# Patient Record
Sex: Male | Born: 1955 | Race: White | Hispanic: No | Marital: Married | Smoking: Never smoker
Health system: Southern US, Community
[De-identification: ages and names within clinical notes are randomized; demographics above are authoritative.]

## PROBLEM LIST (undated history)

## (undated) DIAGNOSIS — Z87442 Personal history of urinary calculi: Secondary | ICD-10-CM

## (undated) DIAGNOSIS — E119 Type 2 diabetes mellitus without complications: Secondary | ICD-10-CM

---

## 2016-03-15 ENCOUNTER — Other Ambulatory Visit (HOSPITAL_COMMUNITY): Payer: Self-pay | Admitting: Ophthalmology

## 2016-03-15 DIAGNOSIS — H53451 Other localized visual field defect, right eye: Secondary | ICD-10-CM

## 2016-03-22 ENCOUNTER — Ambulatory Visit (HOSPITAL_COMMUNITY)
Admission: RE | Admit: 2016-03-22 | Discharge: 2016-03-22 | Disposition: A | Discharge status: Home / Self Care | Payer: Medicare Other | Source: Ambulatory Visit | Attending: Ophthalmology | Admitting: Ophthalmology

## 2016-03-22 DIAGNOSIS — H53451 Other localized visual field defect, right eye: Secondary | ICD-10-CM

## 2016-03-22 DIAGNOSIS — R9082 White matter disease, unspecified: Secondary | ICD-10-CM | POA: Diagnosis not present

## 2016-03-22 DIAGNOSIS — J329 Chronic sinusitis, unspecified: Secondary | ICD-10-CM | POA: Insufficient documentation

## 2016-03-22 LAB — POCT I-STAT CREATININE: CREATININE: 1.2 mg/dL (ref 0.61–1.24)

## 2016-03-22 MED ORDER — GADOBENATE DIMEGLUMINE 529 MG/ML IV SOLN
20.0000 mL | Freq: Once | INTRAVENOUS | Status: AC | PRN
  Administered 2016-03-22: 17 mL via INTRAVENOUS

## 2016-12-07 DIAGNOSIS — H401123 Primary open-angle glaucoma, left eye, severe stage: Secondary | ICD-10-CM | POA: Diagnosis not present

## 2016-12-07 DIAGNOSIS — H401112 Primary open-angle glaucoma, right eye, moderate stage: Secondary | ICD-10-CM | POA: Diagnosis not present

## 2017-01-13 DIAGNOSIS — H401112 Primary open-angle glaucoma, right eye, moderate stage: Secondary | ICD-10-CM | POA: Diagnosis not present

## 2017-01-13 DIAGNOSIS — H401123 Primary open-angle glaucoma, left eye, severe stage: Secondary | ICD-10-CM | POA: Diagnosis not present

## 2017-04-04 DIAGNOSIS — R42 Dizziness and giddiness: Secondary | ICD-10-CM | POA: Diagnosis not present

## 2017-04-26 DIAGNOSIS — H5212 Myopia, left eye: Secondary | ICD-10-CM | POA: Diagnosis not present

## 2017-04-26 DIAGNOSIS — H524 Presbyopia: Secondary | ICD-10-CM | POA: Diagnosis not present

## 2017-04-26 DIAGNOSIS — H52222 Regular astigmatism, left eye: Secondary | ICD-10-CM | POA: Diagnosis not present

## 2017-04-26 DIAGNOSIS — H401132 Primary open-angle glaucoma, bilateral, moderate stage: Secondary | ICD-10-CM | POA: Diagnosis not present

## 2017-04-26 DIAGNOSIS — H04123 Dry eye syndrome of bilateral lacrimal glands: Secondary | ICD-10-CM | POA: Diagnosis not present

## 2017-06-21 DIAGNOSIS — H401112 Primary open-angle glaucoma, right eye, moderate stage: Secondary | ICD-10-CM | POA: Diagnosis not present

## 2017-06-21 DIAGNOSIS — H401123 Primary open-angle glaucoma, left eye, severe stage: Secondary | ICD-10-CM | POA: Diagnosis not present

## 2017-07-23 DIAGNOSIS — K8050 Calculus of bile duct without cholangitis or cholecystitis without obstruction: Secondary | ICD-10-CM | POA: Diagnosis not present

## 2017-07-23 DIAGNOSIS — R112 Nausea with vomiting, unspecified: Secondary | ICD-10-CM | POA: Diagnosis not present

## 2017-07-23 DIAGNOSIS — Z7984 Long term (current) use of oral hypoglycemic drugs: Secondary | ICD-10-CM | POA: Diagnosis not present

## 2017-07-23 DIAGNOSIS — K819 Cholecystitis, unspecified: Secondary | ICD-10-CM | POA: Diagnosis not present

## 2017-07-23 DIAGNOSIS — K2900 Acute gastritis without bleeding: Secondary | ICD-10-CM | POA: Diagnosis not present

## 2017-07-23 DIAGNOSIS — K8012 Calculus of gallbladder with acute and chronic cholecystitis without obstruction: Secondary | ICD-10-CM | POA: Diagnosis not present

## 2017-07-23 DIAGNOSIS — R102 Pelvic and perineal pain: Secondary | ICD-10-CM | POA: Diagnosis not present

## 2017-07-23 DIAGNOSIS — R7303 Prediabetes: Secondary | ICD-10-CM | POA: Diagnosis not present

## 2017-07-23 DIAGNOSIS — K8043 Calculus of bile duct with acute cholecystitis with obstruction: Secondary | ICD-10-CM | POA: Diagnosis not present

## 2017-07-23 DIAGNOSIS — R69 Illness, unspecified: Secondary | ICD-10-CM | POA: Diagnosis not present

## 2017-07-23 DIAGNOSIS — K838 Other specified diseases of biliary tract: Secondary | ICD-10-CM | POA: Diagnosis not present

## 2017-07-23 DIAGNOSIS — K4090 Unilateral inguinal hernia, without obstruction or gangrene, not specified as recurrent: Secondary | ICD-10-CM | POA: Diagnosis not present

## 2017-07-23 DIAGNOSIS — E119 Type 2 diabetes mellitus without complications: Secondary | ICD-10-CM | POA: Diagnosis not present

## 2017-07-23 DIAGNOSIS — K8070 Calculus of gallbladder and bile duct without cholecystitis without obstruction: Secondary | ICD-10-CM | POA: Diagnosis not present

## 2017-07-23 DIAGNOSIS — K8040 Calculus of bile duct with cholecystitis, unspecified, without obstruction: Secondary | ICD-10-CM | POA: Diagnosis not present

## 2017-07-23 DIAGNOSIS — R740 Nonspecific elevation of levels of transaminase and lactic acid dehydrogenase [LDH]: Secondary | ICD-10-CM | POA: Diagnosis not present

## 2017-07-23 DIAGNOSIS — R945 Abnormal results of liver function studies: Secondary | ICD-10-CM | POA: Diagnosis not present

## 2017-07-23 DIAGNOSIS — K830 Cholangitis: Secondary | ICD-10-CM | POA: Diagnosis not present

## 2017-07-23 DIAGNOSIS — B179 Acute viral hepatitis, unspecified: Secondary | ICD-10-CM | POA: Diagnosis not present

## 2017-07-23 DIAGNOSIS — R1110 Vomiting, unspecified: Secondary | ICD-10-CM | POA: Diagnosis not present

## 2017-07-23 DIAGNOSIS — R9431 Abnormal electrocardiogram [ECG] [EKG]: Secondary | ICD-10-CM | POA: Diagnosis not present

## 2017-07-23 DIAGNOSIS — K269 Duodenal ulcer, unspecified as acute or chronic, without hemorrhage or perforation: Secondary | ICD-10-CM | POA: Diagnosis not present

## 2017-07-23 DIAGNOSIS — K2950 Unspecified chronic gastritis without bleeding: Secondary | ICD-10-CM | POA: Diagnosis not present

## 2017-07-23 DIAGNOSIS — R0789 Other chest pain: Secondary | ICD-10-CM | POA: Diagnosis not present

## 2017-07-23 DIAGNOSIS — R933 Abnormal findings on diagnostic imaging of other parts of digestive tract: Secondary | ICD-10-CM | POA: Diagnosis not present

## 2017-07-23 DIAGNOSIS — R109 Unspecified abdominal pain: Secondary | ICD-10-CM | POA: Diagnosis not present

## 2017-07-23 DIAGNOSIS — K8032 Calculus of bile duct with acute cholangitis without obstruction: Secondary | ICD-10-CM | POA: Diagnosis not present

## 2017-07-23 DIAGNOSIS — K8020 Calculus of gallbladder without cholecystitis without obstruction: Secondary | ICD-10-CM | POA: Diagnosis not present

## 2017-07-23 DIAGNOSIS — B199 Unspecified viral hepatitis without hepatic coma: Secondary | ICD-10-CM | POA: Diagnosis not present

## 2017-07-23 DIAGNOSIS — R12 Heartburn: Secondary | ICD-10-CM | POA: Diagnosis not present

## 2017-07-23 DIAGNOSIS — H409 Unspecified glaucoma: Secondary | ICD-10-CM | POA: Diagnosis not present

## 2017-07-23 DIAGNOSIS — I10 Essential (primary) hypertension: Secondary | ICD-10-CM | POA: Diagnosis not present

## 2017-07-23 DIAGNOSIS — M1990 Unspecified osteoarthritis, unspecified site: Secondary | ICD-10-CM | POA: Diagnosis not present

## 2017-07-23 DIAGNOSIS — R197 Diarrhea, unspecified: Secondary | ICD-10-CM | POA: Diagnosis not present

## 2017-07-23 DIAGNOSIS — Z7982 Long term (current) use of aspirin: Secondary | ICD-10-CM | POA: Diagnosis not present

## 2017-07-23 DIAGNOSIS — K760 Fatty (change of) liver, not elsewhere classified: Secondary | ICD-10-CM | POA: Diagnosis not present

## 2017-07-23 DIAGNOSIS — R748 Abnormal levels of other serum enzymes: Secondary | ICD-10-CM | POA: Diagnosis not present

## 2017-07-23 DIAGNOSIS — R140 Abdominal distension (gaseous): Secondary | ICD-10-CM | POA: Diagnosis not present

## 2017-07-23 DIAGNOSIS — Z7401 Bed confinement status: Secondary | ICD-10-CM | POA: Diagnosis not present

## 2017-07-25 DIAGNOSIS — K8032 Calculus of bile duct with acute cholangitis without obstruction: Secondary | ICD-10-CM | POA: Diagnosis not present

## 2017-07-25 DIAGNOSIS — R7303 Prediabetes: Secondary | ICD-10-CM | POA: Diagnosis not present

## 2017-07-25 DIAGNOSIS — K8050 Calculus of bile duct without cholangitis or cholecystitis without obstruction: Secondary | ICD-10-CM | POA: Diagnosis not present

## 2017-07-25 DIAGNOSIS — K4090 Unilateral inguinal hernia, without obstruction or gangrene, not specified as recurrent: Secondary | ICD-10-CM | POA: Diagnosis not present

## 2017-07-25 DIAGNOSIS — K8043 Calculus of bile duct with acute cholecystitis with obstruction: Secondary | ICD-10-CM | POA: Diagnosis not present

## 2017-07-25 DIAGNOSIS — R740 Nonspecific elevation of levels of transaminase and lactic acid dehydrogenase [LDH]: Secondary | ICD-10-CM | POA: Diagnosis not present

## 2017-07-25 DIAGNOSIS — K838 Other specified diseases of biliary tract: Secondary | ICD-10-CM | POA: Diagnosis not present

## 2017-07-25 DIAGNOSIS — K830 Cholangitis: Secondary | ICD-10-CM | POA: Diagnosis not present

## 2017-07-25 DIAGNOSIS — K8040 Calculus of bile duct with cholecystitis, unspecified, without obstruction: Secondary | ICD-10-CM | POA: Diagnosis not present

## 2017-07-25 DIAGNOSIS — K2950 Unspecified chronic gastritis without bleeding: Secondary | ICD-10-CM | POA: Diagnosis not present

## 2017-07-25 DIAGNOSIS — K269 Duodenal ulcer, unspecified as acute or chronic, without hemorrhage or perforation: Secondary | ICD-10-CM | POA: Diagnosis not present

## 2017-07-25 DIAGNOSIS — Z7982 Long term (current) use of aspirin: Secondary | ICD-10-CM | POA: Diagnosis not present

## 2017-07-25 DIAGNOSIS — H409 Unspecified glaucoma: Secondary | ICD-10-CM | POA: Diagnosis not present

## 2017-07-25 DIAGNOSIS — R748 Abnormal levels of other serum enzymes: Secondary | ICD-10-CM | POA: Diagnosis not present

## 2017-07-25 DIAGNOSIS — K819 Cholecystitis, unspecified: Secondary | ICD-10-CM | POA: Diagnosis not present

## 2017-07-25 DIAGNOSIS — K760 Fatty (change of) liver, not elsewhere classified: Secondary | ICD-10-CM | POA: Diagnosis not present

## 2017-07-25 DIAGNOSIS — K8012 Calculus of gallbladder with acute and chronic cholecystitis without obstruction: Secondary | ICD-10-CM | POA: Diagnosis not present

## 2017-07-25 DIAGNOSIS — R9431 Abnormal electrocardiogram [ECG] [EKG]: Secondary | ICD-10-CM | POA: Diagnosis not present

## 2017-07-25 DIAGNOSIS — R1033 Periumbilical pain: Secondary | ICD-10-CM | POA: Diagnosis not present

## 2017-07-25 DIAGNOSIS — E119 Type 2 diabetes mellitus without complications: Secondary | ICD-10-CM | POA: Diagnosis not present

## 2017-07-25 DIAGNOSIS — Z7984 Long term (current) use of oral hypoglycemic drugs: Secondary | ICD-10-CM | POA: Diagnosis not present

## 2017-07-25 DIAGNOSIS — K8309 Other cholangitis: Secondary | ICD-10-CM | POA: Insufficient documentation

## 2017-09-30 DIAGNOSIS — H401123 Primary open-angle glaucoma, left eye, severe stage: Secondary | ICD-10-CM | POA: Diagnosis not present

## 2017-09-30 DIAGNOSIS — H401112 Primary open-angle glaucoma, right eye, moderate stage: Secondary | ICD-10-CM | POA: Diagnosis not present

## 2018-01-31 DIAGNOSIS — H401123 Primary open-angle glaucoma, left eye, severe stage: Secondary | ICD-10-CM | POA: Diagnosis not present

## 2018-03-03 DIAGNOSIS — H04123 Dry eye syndrome of bilateral lacrimal glands: Secondary | ICD-10-CM | POA: Diagnosis not present

## 2018-03-03 DIAGNOSIS — H401132 Primary open-angle glaucoma, bilateral, moderate stage: Secondary | ICD-10-CM | POA: Diagnosis not present

## 2018-03-03 DIAGNOSIS — H2513 Age-related nuclear cataract, bilateral: Secondary | ICD-10-CM | POA: Diagnosis not present

## 2018-04-13 DIAGNOSIS — H2513 Age-related nuclear cataract, bilateral: Secondary | ICD-10-CM | POA: Diagnosis not present

## 2018-04-13 DIAGNOSIS — H401112 Primary open-angle glaucoma, right eye, moderate stage: Secondary | ICD-10-CM | POA: Diagnosis not present

## 2018-04-13 DIAGNOSIS — H401123 Primary open-angle glaucoma, left eye, severe stage: Secondary | ICD-10-CM | POA: Diagnosis not present

## 2018-05-31 DIAGNOSIS — H269 Unspecified cataract: Secondary | ICD-10-CM | POA: Diagnosis not present

## 2018-05-31 DIAGNOSIS — Z79899 Other long term (current) drug therapy: Secondary | ICD-10-CM | POA: Diagnosis not present

## 2018-05-31 DIAGNOSIS — H401123 Primary open-angle glaucoma, left eye, severe stage: Secondary | ICD-10-CM | POA: Diagnosis not present

## 2018-05-31 DIAGNOSIS — H401112 Primary open-angle glaucoma, right eye, moderate stage: Secondary | ICD-10-CM | POA: Diagnosis not present

## 2018-06-13 DIAGNOSIS — Z79899 Other long term (current) drug therapy: Secondary | ICD-10-CM | POA: Diagnosis not present

## 2018-06-13 DIAGNOSIS — H2513 Age-related nuclear cataract, bilateral: Secondary | ICD-10-CM | POA: Diagnosis not present

## 2018-06-13 DIAGNOSIS — H401112 Primary open-angle glaucoma, right eye, moderate stage: Secondary | ICD-10-CM | POA: Diagnosis not present

## 2018-06-13 DIAGNOSIS — H401123 Primary open-angle glaucoma, left eye, severe stage: Secondary | ICD-10-CM | POA: Diagnosis not present

## 2018-07-26 DIAGNOSIS — H401112 Primary open-angle glaucoma, right eye, moderate stage: Secondary | ICD-10-CM | POA: Diagnosis not present

## 2018-07-26 DIAGNOSIS — H269 Unspecified cataract: Secondary | ICD-10-CM | POA: Diagnosis not present

## 2018-07-26 DIAGNOSIS — Z79899 Other long term (current) drug therapy: Secondary | ICD-10-CM | POA: Diagnosis not present

## 2018-07-26 DIAGNOSIS — H401123 Primary open-angle glaucoma, left eye, severe stage: Secondary | ICD-10-CM | POA: Diagnosis not present

## 2018-08-25 DIAGNOSIS — Z0100 Encounter for examination of eyes and vision without abnormal findings: Secondary | ICD-10-CM | POA: Diagnosis not present

## 2018-09-20 DIAGNOSIS — G4700 Insomnia, unspecified: Secondary | ICD-10-CM | POA: Diagnosis not present

## 2018-10-01 DIAGNOSIS — W57XXXA Bitten or stung by nonvenomous insect and other nonvenomous arthropods, initial encounter: Secondary | ICD-10-CM | POA: Diagnosis not present

## 2018-10-01 DIAGNOSIS — T148XXA Other injury of unspecified body region, initial encounter: Secondary | ICD-10-CM | POA: Diagnosis not present

## 2018-10-01 DIAGNOSIS — R5383 Other fatigue: Secondary | ICD-10-CM | POA: Diagnosis not present

## 2018-10-17 DIAGNOSIS — Z1211 Encounter for screening for malignant neoplasm of colon: Secondary | ICD-10-CM | POA: Diagnosis not present

## 2018-10-17 DIAGNOSIS — R634 Abnormal weight loss: Secondary | ICD-10-CM | POA: Diagnosis not present

## 2018-10-17 DIAGNOSIS — R030 Elevated blood-pressure reading, without diagnosis of hypertension: Secondary | ICD-10-CM | POA: Diagnosis not present

## 2018-10-17 DIAGNOSIS — R7309 Other abnormal glucose: Secondary | ICD-10-CM | POA: Diagnosis not present

## 2018-10-17 DIAGNOSIS — G4700 Insomnia, unspecified: Secondary | ICD-10-CM | POA: Diagnosis not present

## 2018-10-17 DIAGNOSIS — R69 Illness, unspecified: Secondary | ICD-10-CM | POA: Diagnosis not present

## 2018-10-17 DIAGNOSIS — H409 Unspecified glaucoma: Secondary | ICD-10-CM | POA: Diagnosis not present

## 2018-10-17 DIAGNOSIS — Z125 Encounter for screening for malignant neoplasm of prostate: Secondary | ICD-10-CM | POA: Diagnosis not present

## 2018-10-19 DIAGNOSIS — Z1211 Encounter for screening for malignant neoplasm of colon: Secondary | ICD-10-CM | POA: Diagnosis not present

## 2018-11-14 DIAGNOSIS — H401123 Primary open-angle glaucoma, left eye, severe stage: Secondary | ICD-10-CM | POA: Diagnosis not present

## 2018-11-14 DIAGNOSIS — H401112 Primary open-angle glaucoma, right eye, moderate stage: Secondary | ICD-10-CM | POA: Diagnosis not present

## 2018-11-14 DIAGNOSIS — H2513 Age-related nuclear cataract, bilateral: Secondary | ICD-10-CM | POA: Diagnosis not present

## 2019-04-23 DIAGNOSIS — H2513 Age-related nuclear cataract, bilateral: Secondary | ICD-10-CM | POA: Diagnosis not present

## 2019-04-23 DIAGNOSIS — H401123 Primary open-angle glaucoma, left eye, severe stage: Secondary | ICD-10-CM | POA: Diagnosis not present

## 2019-04-23 DIAGNOSIS — Z79899 Other long term (current) drug therapy: Secondary | ICD-10-CM | POA: Diagnosis not present

## 2019-04-23 DIAGNOSIS — H401112 Primary open-angle glaucoma, right eye, moderate stage: Secondary | ICD-10-CM | POA: Diagnosis not present

## 2019-06-01 DIAGNOSIS — R7303 Prediabetes: Secondary | ICD-10-CM | POA: Diagnosis not present

## 2019-06-01 DIAGNOSIS — Z789 Other specified health status: Secondary | ICD-10-CM | POA: Diagnosis not present

## 2019-06-01 DIAGNOSIS — R69 Illness, unspecified: Secondary | ICD-10-CM | POA: Diagnosis not present

## 2019-06-01 DIAGNOSIS — H409 Unspecified glaucoma: Secondary | ICD-10-CM | POA: Diagnosis not present

## 2019-06-11 DIAGNOSIS — Z79899 Other long term (current) drug therapy: Secondary | ICD-10-CM | POA: Diagnosis not present

## 2019-06-11 DIAGNOSIS — H401123 Primary open-angle glaucoma, left eye, severe stage: Secondary | ICD-10-CM | POA: Diagnosis not present

## 2019-06-11 DIAGNOSIS — H2513 Age-related nuclear cataract, bilateral: Secondary | ICD-10-CM | POA: Diagnosis not present

## 2019-06-11 DIAGNOSIS — H401112 Primary open-angle glaucoma, right eye, moderate stage: Secondary | ICD-10-CM | POA: Diagnosis not present

## 2019-06-18 DIAGNOSIS — Z789 Other specified health status: Secondary | ICD-10-CM | POA: Diagnosis not present

## 2019-06-18 DIAGNOSIS — Z136 Encounter for screening for cardiovascular disorders: Secondary | ICD-10-CM | POA: Diagnosis not present

## 2019-06-18 DIAGNOSIS — Z125 Encounter for screening for malignant neoplasm of prostate: Secondary | ICD-10-CM | POA: Diagnosis not present

## 2019-06-18 DIAGNOSIS — R7303 Prediabetes: Secondary | ICD-10-CM | POA: Diagnosis not present

## 2019-07-27 DIAGNOSIS — Z23 Encounter for immunization: Secondary | ICD-10-CM | POA: Diagnosis not present

## 2019-10-30 DIAGNOSIS — E785 Hyperlipidemia, unspecified: Secondary | ICD-10-CM | POA: Diagnosis not present

## 2019-10-30 DIAGNOSIS — Z23 Encounter for immunization: Secondary | ICD-10-CM | POA: Diagnosis not present

## 2019-10-30 DIAGNOSIS — R7303 Prediabetes: Secondary | ICD-10-CM | POA: Diagnosis not present

## 2019-11-12 DIAGNOSIS — H401123 Primary open-angle glaucoma, left eye, severe stage: Secondary | ICD-10-CM | POA: Diagnosis not present

## 2019-11-12 DIAGNOSIS — H2513 Age-related nuclear cataract, bilateral: Secondary | ICD-10-CM | POA: Diagnosis not present

## 2019-11-12 DIAGNOSIS — H401112 Primary open-angle glaucoma, right eye, moderate stage: Secondary | ICD-10-CM | POA: Diagnosis not present

## 2019-11-14 DIAGNOSIS — G4700 Insomnia, unspecified: Secondary | ICD-10-CM | POA: Diagnosis not present

## 2019-11-14 DIAGNOSIS — R69 Illness, unspecified: Secondary | ICD-10-CM | POA: Diagnosis not present

## 2019-11-14 DIAGNOSIS — R7303 Prediabetes: Secondary | ICD-10-CM | POA: Diagnosis not present

## 2019-11-14 DIAGNOSIS — E785 Hyperlipidemia, unspecified: Secondary | ICD-10-CM | POA: Diagnosis not present

## 2020-01-23 DIAGNOSIS — M79671 Pain in right foot: Secondary | ICD-10-CM | POA: Diagnosis not present

## 2020-03-11 DIAGNOSIS — H401112 Primary open-angle glaucoma, right eye, moderate stage: Secondary | ICD-10-CM | POA: Diagnosis not present

## 2020-03-11 DIAGNOSIS — H2513 Age-related nuclear cataract, bilateral: Secondary | ICD-10-CM | POA: Diagnosis not present

## 2020-03-11 DIAGNOSIS — H401123 Primary open-angle glaucoma, left eye, severe stage: Secondary | ICD-10-CM | POA: Diagnosis not present

## 2020-03-11 DIAGNOSIS — H269 Unspecified cataract: Secondary | ICD-10-CM | POA: Diagnosis not present

## 2020-06-27 DIAGNOSIS — R7303 Prediabetes: Secondary | ICD-10-CM | POA: Diagnosis not present

## 2020-06-27 DIAGNOSIS — R030 Elevated blood-pressure reading, without diagnosis of hypertension: Secondary | ICD-10-CM | POA: Diagnosis not present

## 2020-06-27 DIAGNOSIS — Z0000 Encounter for general adult medical examination without abnormal findings: Secondary | ICD-10-CM | POA: Diagnosis not present

## 2020-06-27 DIAGNOSIS — E785 Hyperlipidemia, unspecified: Secondary | ICD-10-CM | POA: Diagnosis not present

## 2020-06-27 DIAGNOSIS — Z1211 Encounter for screening for malignant neoplasm of colon: Secondary | ICD-10-CM | POA: Diagnosis not present

## 2020-06-27 DIAGNOSIS — M79671 Pain in right foot: Secondary | ICD-10-CM | POA: Diagnosis not present

## 2020-06-27 DIAGNOSIS — Z125 Encounter for screening for malignant neoplasm of prostate: Secondary | ICD-10-CM | POA: Diagnosis not present

## 2020-07-15 DIAGNOSIS — Z23 Encounter for immunization: Secondary | ICD-10-CM | POA: Diagnosis not present

## 2020-07-18 ENCOUNTER — Other Ambulatory Visit: Payer: Self-pay

## 2020-07-18 ENCOUNTER — Ambulatory Visit: Payer: Medicare HMO | Admitting: Podiatry

## 2020-07-18 ENCOUNTER — Ambulatory Visit (INDEPENDENT_AMBULATORY_CARE_PROVIDER_SITE_OTHER): Payer: Medicare HMO

## 2020-07-18 ENCOUNTER — Encounter: Payer: Self-pay | Admitting: Podiatry

## 2020-07-18 DIAGNOSIS — M7751 Other enthesopathy of right foot: Secondary | ICD-10-CM | POA: Diagnosis not present

## 2020-07-18 DIAGNOSIS — M722 Plantar fascial fibromatosis: Secondary | ICD-10-CM

## 2020-07-18 DIAGNOSIS — M79672 Pain in left foot: Secondary | ICD-10-CM | POA: Diagnosis not present

## 2020-07-18 DIAGNOSIS — M7730 Calcaneal spur, unspecified foot: Secondary | ICD-10-CM | POA: Diagnosis not present

## 2020-07-18 MED ORDER — MELOXICAM 15 MG PO TABS: 15.0000 mg | ORAL_TABLET | Freq: Every day | ORAL | 0 refills | Status: AC

## 2020-07-18 NOTE — Patient Instructions (Signed)
For instructions on how to put on your Plantar Fascial Brace, please visit [email or web address]   Plantar Fasciitis (Heel Spur Syndrome) with Rehab The plantar fascia is a fibrous, ligament-like, soft-tissue structure that spans the bottom of the foot. Plantar fasciitis is a condition that causes pain in the foot due to inflammation of the tissue. SYMPTOMS   Pain and tenderness on the underneath side of the foot.  Pain that worsens with standing or walking. CAUSES  Plantar fasciitis is caused by irritation and injury to the plantar fascia on the underneath side of the foot. Common mechanisms of injury include:  Direct trauma to bottom of the foot.  Damage to a small nerve that runs under the foot where the main fascia attaches to the heel bone.  Stress placed on the plantar fascia due to bone spurs. RISK INCREASES WITH:   Activities that place stress on the plantar fascia (running, jumping, pivoting, or cutting).  Poor strength and flexibility.  Improperly fitted shoes.  Tight calf muscles.  Flat feet.  Failure to warm-up properly before activity.  Obesity. PREVENTION  Warm up and stretch properly before activity.  Allow for adequate recovery between workouts.  Maintain physical fitness:  Strength, flexibility, and endurance.  Cardiovascular fitness.  Maintain a health body weight.  Avoid stress on the plantar fascia.  Wear properly fitted shoes, including arch supports for individuals who have flat feet.  PROGNOSIS  If treated properly, then the symptoms of plantar fasciitis usually resolve without surgery. However, occasionally surgery is necessary.  RELATED COMPLICATIONS   Recurrent symptoms that may result in a chronic condition.  Problems of the lower back that are caused by compensating for the injury, such as limping.  Pain or weakness of the foot during push-off following surgery.  Chronic inflammation, scarring, and partial or complete  fascia tear, occurring more often from repeated injections.  TREATMENT  Treatment initially involves the use of ice and medication to help reduce pain and inflammation. The use of strengthening and stretching exercises may help reduce pain with activity, especially stretches of the Achilles tendon. These exercises may be performed at home or with a therapist. Your caregiver may recommend that you use heel cups of arch supports to help reduce stress on the plantar fascia. Occasionally, corticosteroid injections are given to reduce inflammation. If symptoms persist for greater than 6 months despite non-surgical (conservative), then surgery may be recommended.   MEDICATION   If pain medication is necessary, then nonsteroidal anti-inflammatory medications, such as aspirin and ibuprofen, or other minor pain relievers, such as acetaminophen, are often recommended.  Do not take pain medication within 7 days before surgery.  Prescription pain relievers may be given if deemed necessary by your caregiver. Use only as directed and only as much as you need.  Corticosteroid injections may be given by your caregiver. These injections should be reserved for the most serious cases, because they may only be given a certain number of times.  HEAT AND COLD  Cold treatment (icing) relieves pain and reduces inflammation. Cold treatment should be applied for 10 to 15 minutes every 2 to 3 hours for inflammation and pain and immediately after any activity that aggravates your symptoms. Use ice packs or massage the area with a piece of ice (ice massage).  Heat treatment may be used prior to performing the stretching and strengthening activities prescribed by your caregiver, physical therapist, or athletic trainer. Use a heat pack or soak the injury in warm water.  SEEK IMMEDIATE MEDICAL   CARE IF:  Treatment seems to offer no benefit, or the condition worsens.  Any medications produce adverse side effects.   EXERCISES- RANGE OF MOTION (ROM) AND STRETCHING EXERCISES - Plantar Fasciitis (Heel Spur Syndrome) These exercises may help you when beginning to rehabilitate your injury. Your symptoms may resolve with or without further involvement from your physician, physical therapist or athletic trainer. While completing these exercises, remember:   Restoring tissue flexibility helps normal motion to return to the joints. This allows healthier, less painful movement and activity.  An effective stretch should be held for at least 30 seconds.  A stretch should never be painful. You should only feel a gentle lengthening or release in the stretched tissue.  RANGE OF MOTION - Toe Extension, Flexion  Sit with your right / left leg crossed over your opposite knee.  Grasp your toes and gently pull them back toward the top of your foot. You should feel a stretch on the bottom of your toes and/or foot.  Hold this stretch for 10 seconds.  Now, gently pull your toes toward the bottom of your foot. You should feel a stretch on the top of your toes and or foot.  Hold this stretch for 10 seconds. Repeat  times. Complete this stretch 3 times per day.   RANGE OF MOTION - Ankle Dorsiflexion, Active Assisted  Remove shoes and sit on a chair that is preferably not on a carpeted surface.  Place right / left foot under knee. Extend your opposite leg for support.  Keeping your heel down, slide your right / left foot back toward the chair until you feel a stretch at your ankle or calf. If you do not feel a stretch, slide your bottom forward to the edge of the chair, while still keeping your heel down.  Hold this stretch for 10 seconds. Repeat 3 times. Complete this stretch 2 times per day.   STRETCH  Gastroc, Standing  Place hands on wall.  Extend right / left leg, keeping the front knee somewhat bent.  Slightly point your toes inward on your back foot.  Keeping your right / left heel on the floor and your  knee straight, shift your weight toward the wall, not allowing your back to arch.  You should feel a gentle stretch in the right / left calf. Hold this position for 10 seconds. Repeat 3 times. Complete this stretch 2 times per day.  STRETCH  Soleus, Standing  Place hands on wall.  Extend right / left leg, keeping the other knee somewhat bent.  Slightly point your toes inward on your back foot.  Keep your right / left heel on the floor, bend your back knee, and slightly shift your weight over the back leg so that you feel a gentle stretch deep in your back calf.  Hold this position for 10 seconds. Repeat 3 times. Complete this stretch 2 times per day.  STRETCH  Gastrocsoleus, Standing  Note: This exercise can place a lot of stress on your foot and ankle. Please complete this exercise only if specifically instructed by your caregiver.   Place the ball of your right / left foot on a step, keeping your other foot firmly on the same step.  Hold on to the wall or a rail for balance.  Slowly lift your other foot, allowing your body weight to press your heel down over the edge of the step.  You should feel a stretch in your right / left calf.  Hold this   position for 10 seconds.  Repeat this exercise with a slight bend in your right / left knee. Repeat 3 times. Complete this stretch 2 times per day.   STRENGTHENING EXERCISES - Plantar Fasciitis (Heel Spur Syndrome)  These exercises may help you when beginning to rehabilitate your injury. They may resolve your symptoms with or without further involvement from your physician, physical therapist or athletic trainer. While completing these exercises, remember:   Muscles can gain both the endurance and the strength needed for everyday activities through controlled exercises.  Complete these exercises as instructed by your physician, physical therapist or athletic trainer. Progress the resistance and repetitions only as guided.  STRENGTH -  Towel Curls  Sit in a chair positioned on a non-carpeted surface.  Place your foot on a towel, keeping your heel on the floor.  Pull the towel toward your heel by only curling your toes. Keep your heel on the floor. Repeat 3 times. Complete this exercise 2 times per day.  STRENGTH - Ankle Inversion  Secure one end of a rubber exercise band/tubing to a fixed object (table, pole). Loop the other end around your foot just before your toes.  Place your fists between your knees. This will focus your strengthening at your ankle.  Slowly, pull your big toe up and in, making sure the band/tubing is positioned to resist the entire motion.  Hold this position for 10 seconds.  Have your muscles resist the band/tubing as it slowly pulls your foot back to the starting position. Repeat 3 times. Complete this exercises 2 times per day.  Document Released: 10/18/2005 Document Revised: 01/10/2012 Document Reviewed: 01/30/2009 [employer] Patient Information 2014 [employer]. 

## 2020-07-18 NOTE — Progress Notes (Signed)
Subjective:   Patient ID: Ethan Cox, male   DOB: 63 y.o.   MRN: [id number]   HPI 64 year old male presents the office today for concerns of bilateral heel pain, plan fasciitis which has been ongoing since March 2021. Over the last couple months he started get pain to the lateral aspect of the ankle on the right side. Denies any recent injury or falls. Due to the plantar fasciitis has been doing aggressive stretching to help with this which is doing better but it seems aggravate the ankle. He has not seen any swelling. No recent injury or falls. No rating pain or weakness. He did purchase some Dr. Margart Sickles inserts which were not helping. He also wears sketchers for shoes.   Review of Systems  All other systems reviewed and are negative.  History reviewed. No pertinent past medical history.  History reviewed. No pertinent surgical history.   Current Outpatient Medications:  .  acetaminophen (TYLENOL) 325 MG tablet, Take by mouth., Disp: , Rfl:  .  brimonidine (ALPHAGAN) 0.2 % ophthalmic solution, Place 1 drop into the left eye 3 times daily., Disp: , Rfl:  .  dorzolamide-timolol (COSOPT) 22.3-6.8 MG/ML ophthalmic solution, Place 1 drop into both eyes 2 times daily., Disp: , Rfl:  .  latanoprost (XALATAN) 0.005 % ophthalmic solution, INSTILL 1 DROP INTO BOTH EYES EVERY DAY AT NIGHT, Disp: , Rfl:  .  metFORMIN (GLUCOPHAGE) 500 MG tablet, Take by mouth., Disp: , Rfl:  .  oxyCODONE (OXY IR/ROXICODONE) 5 MG immediate release tablet, Take by mouth., Disp: , Rfl:  .  pantoprazole (PROTONIX) 40 MG tablet, Take by mouth., Disp: , Rfl:  .  ALPRAZolam (XANAX) 0.5 MG tablet, Take 0.25-0.5 mg by mouth at bedtime as needed., Disp: , Rfl:  .  aspirin 81 MG chewable tablet, Chew by mouth., Disp: , Rfl:  .  dorzolamide-timolol (COSOPT) 22.3-6.8 MG/ML ophthalmic solution, 1 drop 2 (two) times daily., Disp: , Rfl:  .  ibuprofen (ADVIL) 200 MG tablet, Take by mouth., Disp: , Rfl:  .  meloxicam (MOBIC) 15 MG  tablet, Take 1 tablet (15 mg total) by mouth daily., Disp: 30 tablet, Rfl: 0  No Known Allergies       Objective:  Physical Exam  General: AAO x3, NAD  Dermatological: Skin is warm, dry and supple bilateral. There are no open sores, no preulcerative lesions, no rash or signs of infection present.  Vascular: Dorsalis Pedis artery and Posterior Tibial artery pedal pulses are 2/4 bilateral with immedate capillary fill time. There is no pain with calf compression, swelling, warmth, erythema.   Neruologic: Grossly intact via light touch bilateral. Negative L-spine  Musculoskeletal: There is tenderness palpation on plantar medial tubercle of the calcaneus at the insertion of plantar fascia bilaterally. Plantar fascia appears to be intact. Achilles tendon appears to be intact but he does describe some discomfort on the right side of the Achilles tendon. There is no significant pain to the Achilles tendon today. Mild discomfort along the course the peroneal tendons inferior posterior lateral malleolus. Minimal discomfort in the anterior lateral aspect ankle joint. No area of pinpoint tenderness. Muscular strength 5/5 in all groups tested bilateral. Calcaneal varus is present  Gait: Unassisted, Nonantalgic.       Assessment:   Bilateral chronic plantar fasciitis with the right capsulitis/tendinitis likely result of compensation     Plan:  -Treatment options discussed including all alternatives, risks, and complications -Etiology of symptoms were discussed -X-rays were obtained and independently reviewed I  also reviewed them with him. Calcaneal spurring is evident there is no evidence of acute fracture. -Prescribed meloxicam and discussed side effects. He wants a local steroid injection. Recommend ice daily. -Stretching, icing daily -Plantar fascial braces dispensed x2 -He has a night splint at home -Discussed changing shoes. Also discussed custom orthotics. He can start by changing shoes  first.  Vivi Barrack DPM

## 2020-07-21 DIAGNOSIS — H269 Unspecified cataract: Secondary | ICD-10-CM | POA: Diagnosis not present

## 2020-07-21 DIAGNOSIS — H524 Presbyopia: Secondary | ICD-10-CM | POA: Diagnosis not present

## 2020-07-22 DIAGNOSIS — H5213 Myopia, bilateral: Secondary | ICD-10-CM | POA: Diagnosis not present

## 2020-07-22 DIAGNOSIS — H52209 Unspecified astigmatism, unspecified eye: Secondary | ICD-10-CM | POA: Diagnosis not present

## 2020-07-28 DIAGNOSIS — R972 Elevated prostate specific antigen [PSA]: Secondary | ICD-10-CM | POA: Diagnosis not present

## 2020-08-22 ENCOUNTER — Ambulatory Visit: Payer: Medicare HMO | Admitting: Podiatry

## 2020-09-09 DIAGNOSIS — H401123 Primary open-angle glaucoma, left eye, severe stage: Secondary | ICD-10-CM | POA: Diagnosis not present

## 2020-09-09 DIAGNOSIS — H2513 Age-related nuclear cataract, bilateral: Secondary | ICD-10-CM | POA: Diagnosis not present

## 2020-09-09 DIAGNOSIS — H401112 Primary open-angle glaucoma, right eye, moderate stage: Secondary | ICD-10-CM | POA: Diagnosis not present

## 2020-10-07 DIAGNOSIS — R69 Illness, unspecified: Secondary | ICD-10-CM | POA: Diagnosis not present

## 2020-10-22 DIAGNOSIS — Z20822 Contact with and (suspected) exposure to covid-19: Secondary | ICD-10-CM | POA: Diagnosis not present

## 2020-12-04 ENCOUNTER — Emergency Department
Admission: EM | Admit: 2020-12-04 | Discharge: 2020-12-04 | Disposition: A | Discharge status: Home / Self Care | Payer: Medicare HMO | Source: Home / Self Care | Attending: Family Medicine | Admitting: Family Medicine

## 2020-12-04 DIAGNOSIS — K1120 Sialoadenitis, unspecified: Secondary | ICD-10-CM

## 2020-12-04 MED ORDER — AMOXICILLIN-POT CLAVULANATE 875-125 MG PO TABS: 1.0000 | ORAL_TABLET | Freq: Two times a day (BID) | ORAL | 0 refills | Status: AC

## 2020-12-04 NOTE — ED Triage Notes (Signed)
Patient presents to Urgent Care with complaints of right lower dental pain and swelling since earlier today. Patient reports he had a tooth pulled about 2 months ago, does have a dentist appt already 5 days from now.

## 2020-12-04 NOTE — Discharge Instructions (Signed)
Drink enough fluid to keep your urine pale yellow. Keep your mouth clean and moist. Try sucking on sour candy. This may help to make your mouth less dry by stimulating the flow of saliva.   If symptoms become significantly worse during the night or over the weekend, proceed to the local emergency room.

## 2020-12-04 NOTE — ED Provider Notes (Signed)
[facility]    CSN: [id number] Arrival date & time: 12/04/20  1603      History   Chief Complaint Chief Complaint  Patient presents with  . Dental Pain    Right Lower    HPI Ethan Cox is a 65 y.o. male.   Patient complains of onset of sudden painless swelling in his right cheek below his ear at noon today while eating.  He states that he had a right tooth extracted two months ago and is concerned that he may have a tooth abscess.  However, he has had no recent tooth pain or gingival swelling.  He notes that the swelling has been gradually receding, and when he again put food in his mouth he had a sudden burning sensation in his right cheek.  He denies fevers, chills, and sweats.  The history is provided by the patient.    History reviewed. No pertinent past medical history.  Patient Active Problem List   Diagnosis Date Noted  . Ascending cholangitis 07/25/2017    History reviewed. No pertinent surgical history.     Home Medications    Prior to Admission medications   Medication Sig Start Date End Date Taking? Authorizing Provider  amoxicillin-clavulanate (AUGMENTIN) 875-125 MG tablet Take 1 tablet by mouth every 12 (twelve) hours for 10 days. 12/04/20 12/14/20 Yes Lattie Haw, MD  acetaminophen (TYLENOL) 325 MG tablet Take by mouth. 07/28/17   [provider]  ALPRAZolam Prudy Feeler) 0.5 MG tablet Take 0.25-0.5 mg by mouth at bedtime as needed. 04/23/20   [provider]  aspirin 81 MG chewable tablet Chew by mouth.    [provider]  brimonidine (ALPHAGAN) 0.2 % ophthalmic solution Place 1 drop into the left eye 3 times daily. 11/14/18   [provider]  dorzolamide-timolol (COSOPT) 22.3-6.8 MG/ML ophthalmic solution Place 1 drop into both eyes 2 times daily. 07/11/17   [provider]  dorzolamide-timolol (COSOPT) 22.3-6.8 MG/ML ophthalmic solution 1 drop 2 (two) times daily. 06/01/20   [provider]   ibuprofen (ADVIL) 200 MG tablet Take by mouth.    [provider]  latanoprost (XALATAN) 0.005 % ophthalmic solution INSTILL 1 DROP INTO BOTH EYES EVERY DAY AT NIGHT 07/11/17   [provider]  meloxicam (MOBIC) 15 MG tablet Take 1 tablet (15 mg total) by mouth daily. 07/18/20 07/18/21  Vivi Barrack, DPM  metFORMIN (GLUCOPHAGE) 500 MG tablet Take by mouth. 07/28/17   [provider]  oxyCODONE (OXY IR/ROXICODONE) 5 MG immediate release tablet Take by mouth. 07/28/17   [provider]  pantoprazole (PROTONIX) 40 MG tablet Take by mouth. 07/28/17   [provider]    Family History Family History  Problem Relation Age of Onset  . Healthy Mother   . Healthy Father     Social History Social History   Tobacco Use  . Smoking status: Never Smoker  . Smokeless tobacco: Never Used  Substance Use Topics  . Alcohol use: Yes    Comment: occ     Allergies   Patient has no known allergies.   Review of Systems Review of Systems  Constitutional: Negative for activity change, appetite change, chills, diaphoresis, fatigue and fever.  HENT: Positive for dental problem and facial swelling. Negative for congestion, drooling, ear discharge, ear pain, mouth sores, rhinorrhea, sinus pressure, sinus pain, sore throat and trouble swallowing.   All other systems reviewed and are negative.    Physical Exam Triage Vital Signs ED  Triage Vitals  Enc Vitals Group     BP 12/04/20 1635 (!) 160/86     Pulse Rate 12/04/20 1635 84     Resp 12/04/20 1635 16     Temp 12/04/20 1635 98.3 F (36.8 C)     Temp Source 12/04/20 1635 Oral     SpO2 12/04/20 1635 99 %     Weight --      Height --      Head Circumference --      Peak Flow --      Pain Score 12/04/20 1634 2     Pain Loc --      Pain Edu? --      Excl. in GC? --    No data found.  Updated Vital Signs BP (!) 160/86 (BP Location: Right Arm)   Pulse 84   Temp 98.3 F (36.8 C) (Oral)   Resp  16   SpO2 99%   Visual Acuity Right Eye Distance:   Left Eye Distance:   Bilateral Distance:    Right Eye Near:   Left Eye Near:    Bilateral Near:     Physical Exam Vitals and nursing note reviewed.  Constitutional:      General: He is not in acute distress. HENT:     Head:     Jaw: Tenderness present. No trismus.     Salivary Glands: Right salivary gland is diffusely enlarged.      Comments: There is swelling and mild tenderness to palpation over the right inferior parotid gland.  No erythema or warmth.    Right Ear: Tympanic membrane, ear canal and external ear normal.     Left Ear: Tympanic membrane, ear canal and external ear normal.     Nose: Nose normal.     Mouth/Throat:     Mouth: Mucous membranes are moist. No oral lesions.     Dentition: Normal dentition. No dental tenderness, gingival swelling, dental caries or gum lesions.     Pharynx: Oropharynx is clear.  Eyes:     Extraocular Movements: Extraocular movements intact.     Conjunctiva/sclera: Conjunctivae normal.     Pupils: Pupils are equal, round, and reactive to light.  Cardiovascular:     Rate and Rhythm: Normal rate and regular rhythm.     Heart sounds: Normal heart sounds.  Pulmonary:     Breath sounds: Normal breath sounds.  Musculoskeletal:     Cervical back: Neck supple.  Lymphadenopathy:     Cervical: No cervical adenopathy.  Skin:    General: Skin is warm and dry.     Findings: No rash.  Neurological:     Mental Status: He is alert and oriented to person, place, and time.      UC Treatments / Results  Labs (all labs ordered are listed, but only abnormal results are displayed) Labs Reviewed - No data to display  EKG   Radiology No results found.  Procedures Procedures (including critical care time)  Medications Ordered in UC Medications - No data to display  Initial Impression / Assessment and Plan / UC Course  I have reviewed the triage vital signs and the nursing  notes.  Pertinent labs & imaging results that were available during my care of the patient were reviewed by me and considered in my medical decision making (see chart for details).    Suspect salivary gland duct stone. Begin Augmentin. Followup with ENT as soon as possible.    Final Clinical Impressions(s) /  UC Diagnoses   Final diagnoses:  Parotitis not due to mumps     Discharge Instructions     1. Drink enough fluid to keep your urine pale yellow. 2. Keep your mouth clean and moist. 3. Try sucking on sour candy. This may help to make your mouth less dry by stimulating the flow of saliva.   If symptoms become significantly worse during the night or over the weekend, proceed to the local emergency room.     ED Prescriptions    Medication Sig Dispense Auth. Provider   amoxicillin-clavulanate (AUGMENTIN) 875-125 MG tablet Take 1 tablet by mouth every 12 (twelve) hours for 10 days. 20 tablet Lattie Haw, MD        Lattie Haw, MD 12/06/20 [phone]

## 2020-12-12 DIAGNOSIS — H401123 Primary open-angle glaucoma, left eye, severe stage: Secondary | ICD-10-CM | POA: Diagnosis not present

## 2020-12-12 DIAGNOSIS — H2513 Age-related nuclear cataract, bilateral: Secondary | ICD-10-CM | POA: Diagnosis not present

## 2020-12-12 DIAGNOSIS — H401112 Primary open-angle glaucoma, right eye, moderate stage: Secondary | ICD-10-CM | POA: Diagnosis not present

## 2020-12-30 DIAGNOSIS — Z23 Encounter for immunization: Secondary | ICD-10-CM | POA: Diagnosis not present

## 2021-01-23 DIAGNOSIS — H401112 Primary open-angle glaucoma, right eye, moderate stage: Secondary | ICD-10-CM | POA: Diagnosis not present

## 2021-01-23 DIAGNOSIS — H2513 Age-related nuclear cataract, bilateral: Secondary | ICD-10-CM | POA: Diagnosis not present

## 2021-01-23 DIAGNOSIS — H401123 Primary open-angle glaucoma, left eye, severe stage: Secondary | ICD-10-CM | POA: Diagnosis not present

## 2021-01-23 DIAGNOSIS — Z79899 Other long term (current) drug therapy: Secondary | ICD-10-CM | POA: Diagnosis not present

## 2021-02-17 DIAGNOSIS — H2513 Age-related nuclear cataract, bilateral: Secondary | ICD-10-CM | POA: Diagnosis not present

## 2021-02-17 DIAGNOSIS — H401123 Primary open-angle glaucoma, left eye, severe stage: Secondary | ICD-10-CM | POA: Diagnosis not present

## 2021-02-17 DIAGNOSIS — Z79899 Other long term (current) drug therapy: Secondary | ICD-10-CM | POA: Diagnosis not present

## 2021-02-17 DIAGNOSIS — H401112 Primary open-angle glaucoma, right eye, moderate stage: Secondary | ICD-10-CM | POA: Diagnosis not present

## 2021-04-14 DIAGNOSIS — H2513 Age-related nuclear cataract, bilateral: Secondary | ICD-10-CM | POA: Diagnosis not present

## 2021-04-14 DIAGNOSIS — Z79899 Other long term (current) drug therapy: Secondary | ICD-10-CM | POA: Diagnosis not present

## 2021-04-14 DIAGNOSIS — H401112 Primary open-angle glaucoma, right eye, moderate stage: Secondary | ICD-10-CM | POA: Diagnosis not present

## 2021-04-14 DIAGNOSIS — H401123 Primary open-angle glaucoma, left eye, severe stage: Secondary | ICD-10-CM | POA: Diagnosis not present

## 2021-06-24 DIAGNOSIS — H401123 Primary open-angle glaucoma, left eye, severe stage: Secondary | ICD-10-CM | POA: Diagnosis not present

## 2021-06-25 DIAGNOSIS — H401112 Primary open-angle glaucoma, right eye, moderate stage: Secondary | ICD-10-CM | POA: Diagnosis not present

## 2021-06-25 DIAGNOSIS — H401123 Primary open-angle glaucoma, left eye, severe stage: Secondary | ICD-10-CM | POA: Diagnosis not present

## 2021-06-25 DIAGNOSIS — Z48810 Encounter for surgical aftercare following surgery on the sense organs: Secondary | ICD-10-CM | POA: Diagnosis not present

## 2021-06-25 DIAGNOSIS — H2513 Age-related nuclear cataract, bilateral: Secondary | ICD-10-CM | POA: Diagnosis not present

## 2021-06-25 DIAGNOSIS — Z9689 Presence of other specified functional implants: Secondary | ICD-10-CM | POA: Diagnosis not present

## 2021-06-25 DIAGNOSIS — Z79899 Other long term (current) drug therapy: Secondary | ICD-10-CM | POA: Diagnosis not present

## 2021-06-29 DIAGNOSIS — H401123 Primary open-angle glaucoma, left eye, severe stage: Secondary | ICD-10-CM | POA: Diagnosis not present

## 2021-06-29 DIAGNOSIS — Z79899 Other long term (current) drug therapy: Secondary | ICD-10-CM | POA: Diagnosis not present

## 2021-06-29 DIAGNOSIS — Z9689 Presence of other specified functional implants: Secondary | ICD-10-CM | POA: Diagnosis not present

## 2021-06-29 DIAGNOSIS — Z48810 Encounter for surgical aftercare following surgery on the sense organs: Secondary | ICD-10-CM | POA: Diagnosis not present

## 2021-06-29 DIAGNOSIS — H401112 Primary open-angle glaucoma, right eye, moderate stage: Secondary | ICD-10-CM | POA: Diagnosis not present

## 2021-06-29 DIAGNOSIS — H2513 Age-related nuclear cataract, bilateral: Secondary | ICD-10-CM | POA: Diagnosis not present

## 2021-06-30 DIAGNOSIS — H401123 Primary open-angle glaucoma, left eye, severe stage: Secondary | ICD-10-CM | POA: Diagnosis not present

## 2021-06-30 DIAGNOSIS — Z48810 Encounter for surgical aftercare following surgery on the sense organs: Secondary | ICD-10-CM | POA: Diagnosis not present

## 2021-06-30 DIAGNOSIS — H2513 Age-related nuclear cataract, bilateral: Secondary | ICD-10-CM | POA: Diagnosis not present

## 2021-06-30 DIAGNOSIS — Z79899 Other long term (current) drug therapy: Secondary | ICD-10-CM | POA: Diagnosis not present

## 2021-06-30 DIAGNOSIS — H401112 Primary open-angle glaucoma, right eye, moderate stage: Secondary | ICD-10-CM | POA: Diagnosis not present

## 2021-06-30 DIAGNOSIS — Z9689 Presence of other specified functional implants: Secondary | ICD-10-CM | POA: Diagnosis not present

## 2021-07-02 DIAGNOSIS — Z9689 Presence of other specified functional implants: Secondary | ICD-10-CM | POA: Diagnosis not present

## 2021-07-02 DIAGNOSIS — H401112 Primary open-angle glaucoma, right eye, moderate stage: Secondary | ICD-10-CM | POA: Diagnosis not present

## 2021-07-02 DIAGNOSIS — H2513 Age-related nuclear cataract, bilateral: Secondary | ICD-10-CM | POA: Diagnosis not present

## 2021-07-02 DIAGNOSIS — H401123 Primary open-angle glaucoma, left eye, severe stage: Secondary | ICD-10-CM | POA: Diagnosis not present

## 2021-07-02 DIAGNOSIS — Z79899 Other long term (current) drug therapy: Secondary | ICD-10-CM | POA: Diagnosis not present

## 2021-07-02 DIAGNOSIS — Z48810 Encounter for surgical aftercare following surgery on the sense organs: Secondary | ICD-10-CM | POA: Diagnosis not present

## 2021-07-10 DIAGNOSIS — H401123 Primary open-angle glaucoma, left eye, severe stage: Secondary | ICD-10-CM | POA: Diagnosis not present

## 2021-07-10 DIAGNOSIS — H401112 Primary open-angle glaucoma, right eye, moderate stage: Secondary | ICD-10-CM | POA: Diagnosis not present

## 2021-07-10 DIAGNOSIS — Z79899 Other long term (current) drug therapy: Secondary | ICD-10-CM | POA: Diagnosis not present

## 2021-07-10 DIAGNOSIS — Z9883 Filtering (vitreous) bleb after glaucoma surgery status: Secondary | ICD-10-CM | POA: Diagnosis not present

## 2021-07-10 DIAGNOSIS — H2513 Age-related nuclear cataract, bilateral: Secondary | ICD-10-CM | POA: Diagnosis not present

## 2021-07-24 DIAGNOSIS — M25511 Pain in right shoulder: Secondary | ICD-10-CM | POA: Diagnosis not present

## 2021-07-24 DIAGNOSIS — E785 Hyperlipidemia, unspecified: Secondary | ICD-10-CM | POA: Diagnosis not present

## 2021-07-24 DIAGNOSIS — Z23 Encounter for immunization: Secondary | ICD-10-CM | POA: Diagnosis not present

## 2021-07-24 DIAGNOSIS — Z0000 Encounter for general adult medical examination without abnormal findings: Secondary | ICD-10-CM | POA: Diagnosis not present

## 2021-07-24 DIAGNOSIS — Z125 Encounter for screening for malignant neoplasm of prostate: Secondary | ICD-10-CM | POA: Diagnosis not present

## 2021-07-24 DIAGNOSIS — R7303 Prediabetes: Secondary | ICD-10-CM | POA: Diagnosis not present

## 2021-07-24 DIAGNOSIS — Z1211 Encounter for screening for malignant neoplasm of colon: Secondary | ICD-10-CM | POA: Diagnosis not present

## 2021-07-24 DIAGNOSIS — R030 Elevated blood-pressure reading, without diagnosis of hypertension: Secondary | ICD-10-CM | POA: Diagnosis not present

## 2021-08-06 DIAGNOSIS — H2513 Age-related nuclear cataract, bilateral: Secondary | ICD-10-CM | POA: Diagnosis not present

## 2021-08-06 DIAGNOSIS — H401112 Primary open-angle glaucoma, right eye, moderate stage: Secondary | ICD-10-CM | POA: Diagnosis not present

## 2021-08-06 DIAGNOSIS — Z79899 Other long term (current) drug therapy: Secondary | ICD-10-CM | POA: Diagnosis not present

## 2021-08-06 DIAGNOSIS — H401123 Primary open-angle glaucoma, left eye, severe stage: Secondary | ICD-10-CM | POA: Diagnosis not present

## 2021-08-06 DIAGNOSIS — Z9689 Presence of other specified functional implants: Secondary | ICD-10-CM | POA: Diagnosis not present

## 2021-08-06 DIAGNOSIS — Z48810 Encounter for surgical aftercare following surgery on the sense organs: Secondary | ICD-10-CM | POA: Diagnosis not present

## 2021-08-13 DIAGNOSIS — H2513 Age-related nuclear cataract, bilateral: Secondary | ICD-10-CM | POA: Diagnosis not present

## 2021-08-13 DIAGNOSIS — H401123 Primary open-angle glaucoma, left eye, severe stage: Secondary | ICD-10-CM | POA: Diagnosis not present

## 2021-08-13 DIAGNOSIS — Z9689 Presence of other specified functional implants: Secondary | ICD-10-CM | POA: Diagnosis not present

## 2021-08-13 DIAGNOSIS — H401112 Primary open-angle glaucoma, right eye, moderate stage: Secondary | ICD-10-CM | POA: Diagnosis not present

## 2021-08-13 DIAGNOSIS — Z48810 Encounter for surgical aftercare following surgery on the sense organs: Secondary | ICD-10-CM | POA: Diagnosis not present

## 2021-10-01 DIAGNOSIS — H401123 Primary open-angle glaucoma, left eye, severe stage: Secondary | ICD-10-CM | POA: Diagnosis not present

## 2021-10-01 DIAGNOSIS — Z48810 Encounter for surgical aftercare following surgery on the sense organs: Secondary | ICD-10-CM | POA: Diagnosis not present

## 2021-10-01 DIAGNOSIS — Z9689 Presence of other specified functional implants: Secondary | ICD-10-CM | POA: Diagnosis not present

## 2021-10-01 DIAGNOSIS — H2513 Age-related nuclear cataract, bilateral: Secondary | ICD-10-CM | POA: Diagnosis not present

## 2021-10-01 DIAGNOSIS — H401112 Primary open-angle glaucoma, right eye, moderate stage: Secondary | ICD-10-CM | POA: Diagnosis not present

## 2021-10-01 DIAGNOSIS — Z9883 Filtering (vitreous) bleb after glaucoma surgery status: Secondary | ICD-10-CM | POA: Diagnosis not present

## 2021-10-01 DIAGNOSIS — Z79899 Other long term (current) drug therapy: Secondary | ICD-10-CM | POA: Diagnosis not present

## 2021-10-06 DIAGNOSIS — R7309 Other abnormal glucose: Secondary | ICD-10-CM | POA: Diagnosis not present

## 2021-10-12 DIAGNOSIS — H52209 Unspecified astigmatism, unspecified eye: Secondary | ICD-10-CM | POA: Diagnosis not present

## 2021-10-12 DIAGNOSIS — H5213 Myopia, bilateral: Secondary | ICD-10-CM | POA: Diagnosis not present

## 2021-10-12 DIAGNOSIS — H524 Presbyopia: Secondary | ICD-10-CM | POA: Diagnosis not present

## 2021-11-11 DIAGNOSIS — H52209 Unspecified astigmatism, unspecified eye: Secondary | ICD-10-CM | POA: Diagnosis not present

## 2021-11-11 DIAGNOSIS — H5213 Myopia, bilateral: Secondary | ICD-10-CM | POA: Diagnosis not present

## 2021-11-17 IMAGING — DX DG FOOT COMPLETE 3+V*R*
3 series · 3 of 3 positions shown · non-contrast
Comparison: None.

CLINICAL DATA: Heel pain

EXAM:
RIGHT FOOT COMPLETE - 3+ VIEW

[foot lat]
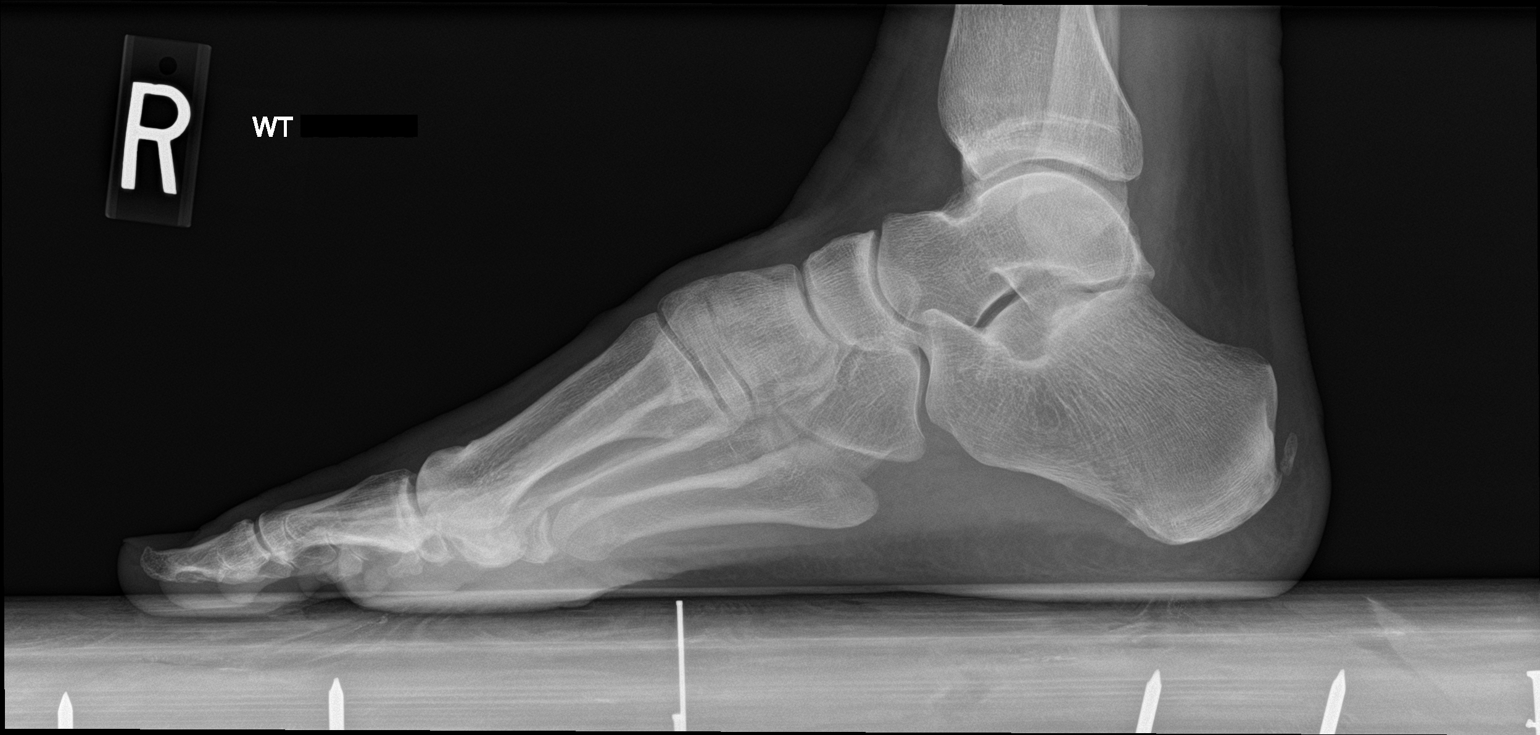

[foot ap]
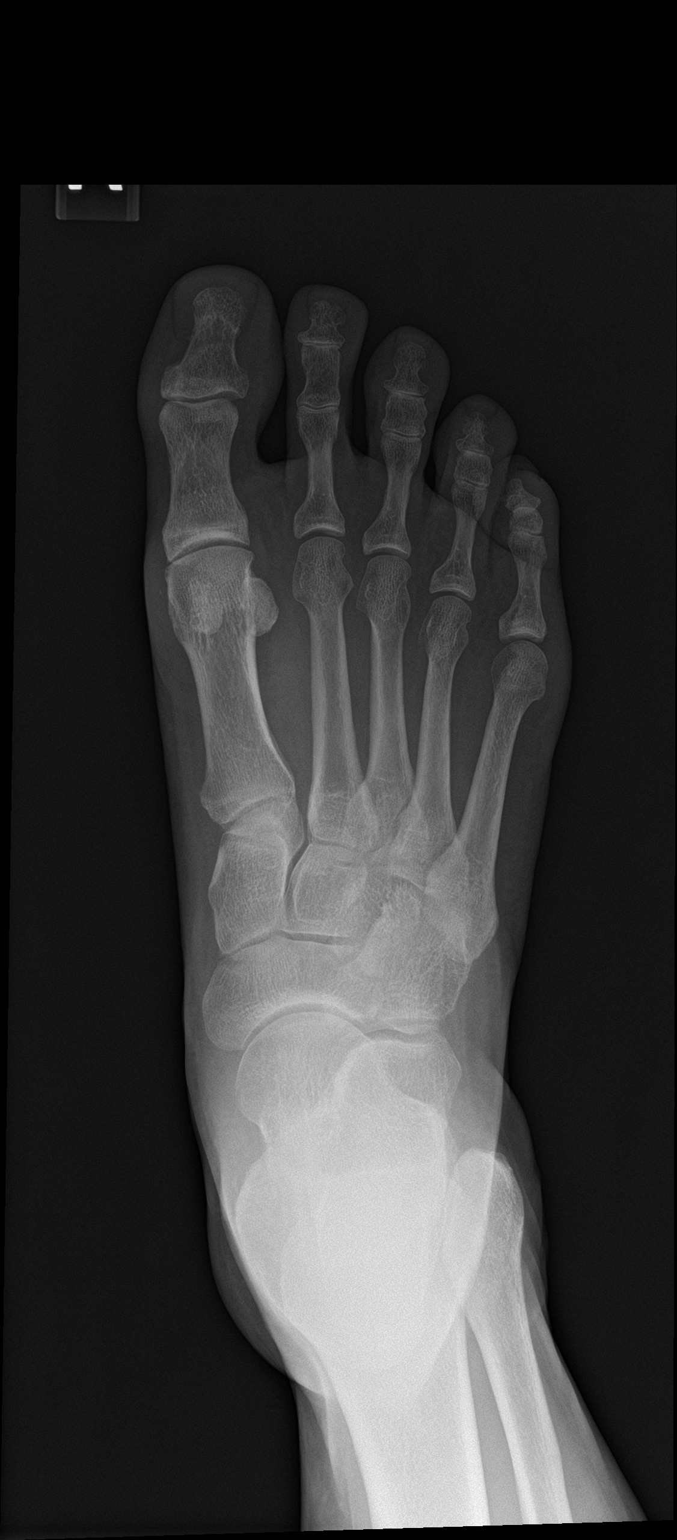

[foot obl]
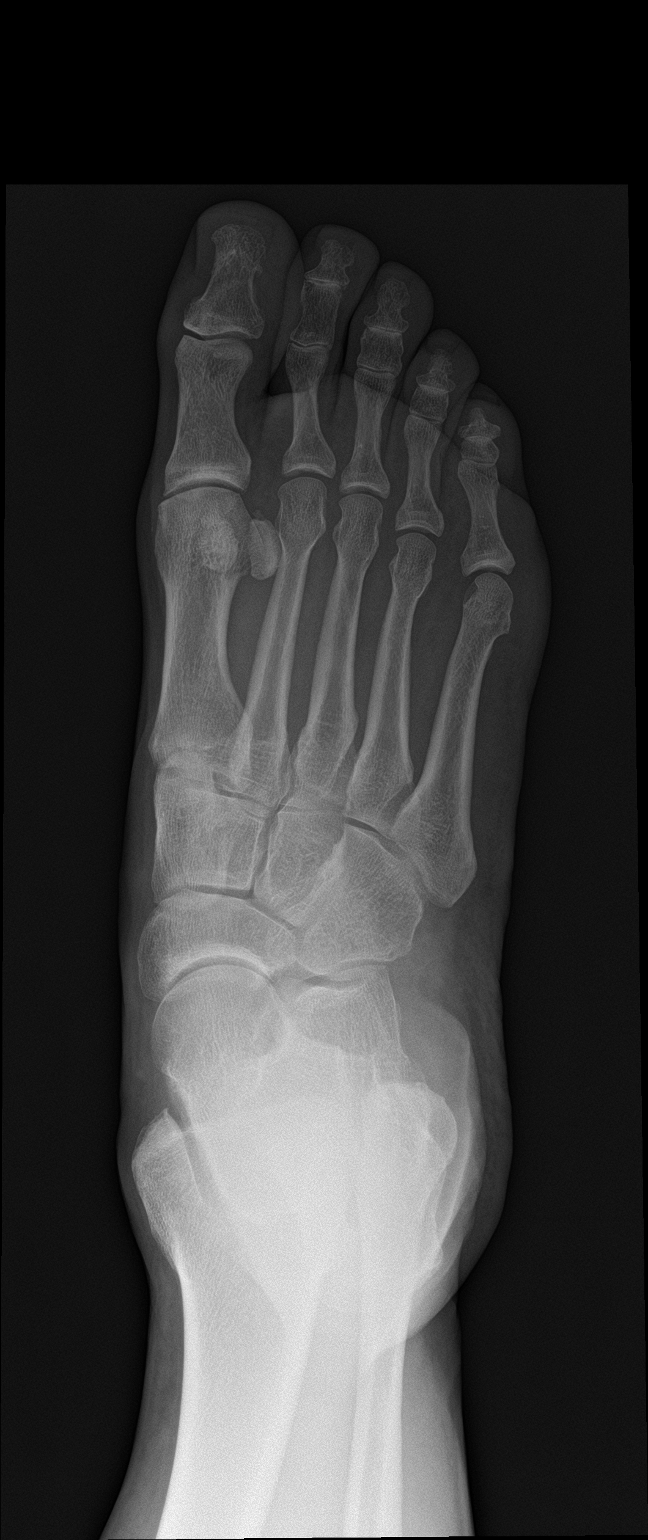

[3 of 3 positions shown; findings below may reference images not displayed]

FINDINGS: No acute fracture or dislocation. Joint spaces and alignment are
maintained. Enthesophyte of the Achilles tendon. No area of erosion
or osseous destruction. No unexpected radiopaque foreign body. Soft
tissues are unremarkable.
IMPRESSION: 1. No acute osseous abnormality, right foot.

## 2021-11-17 IMAGING — DX DG FOOT COMPLETE 3+V*L*
3 series · 3 of 3 positions shown · non-contrast
Comparison: None.

CLINICAL DATA: Heel pain

EXAM:
LEFT FOOT - COMPLETE 3+ VIEW

[foot ap]
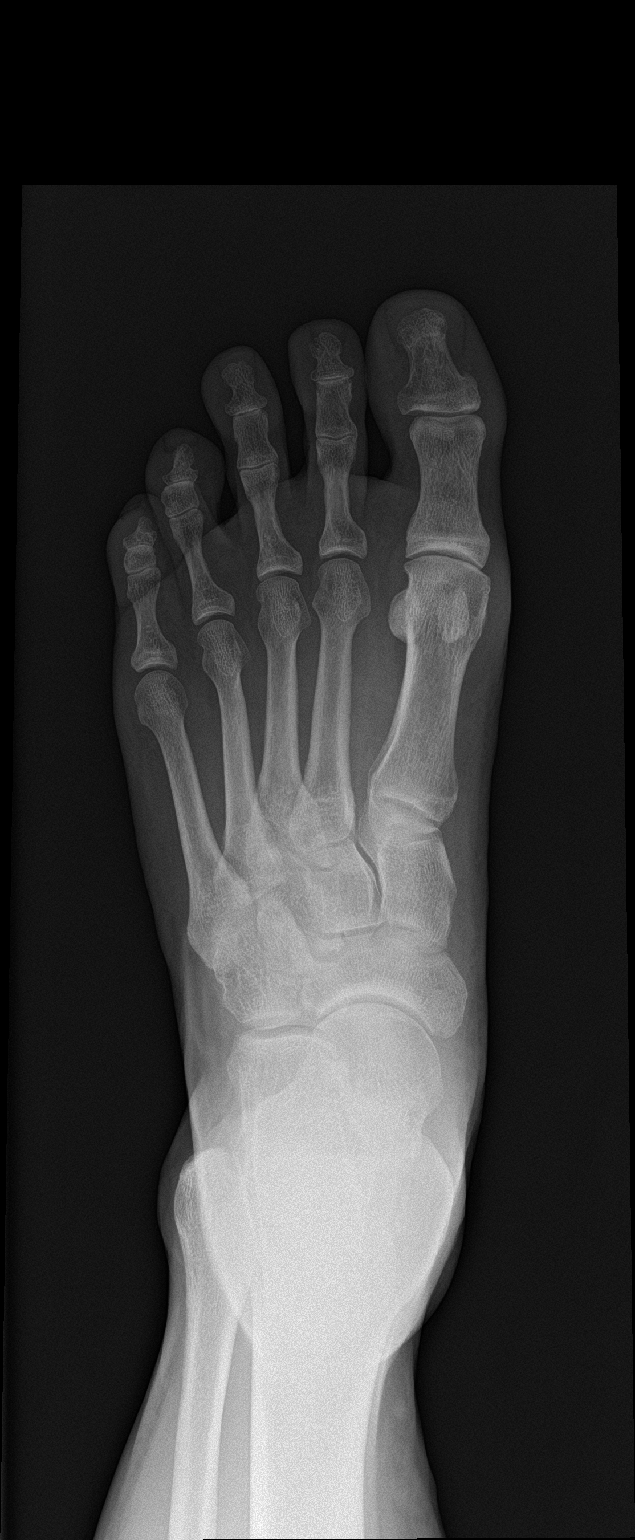

[foot obl]
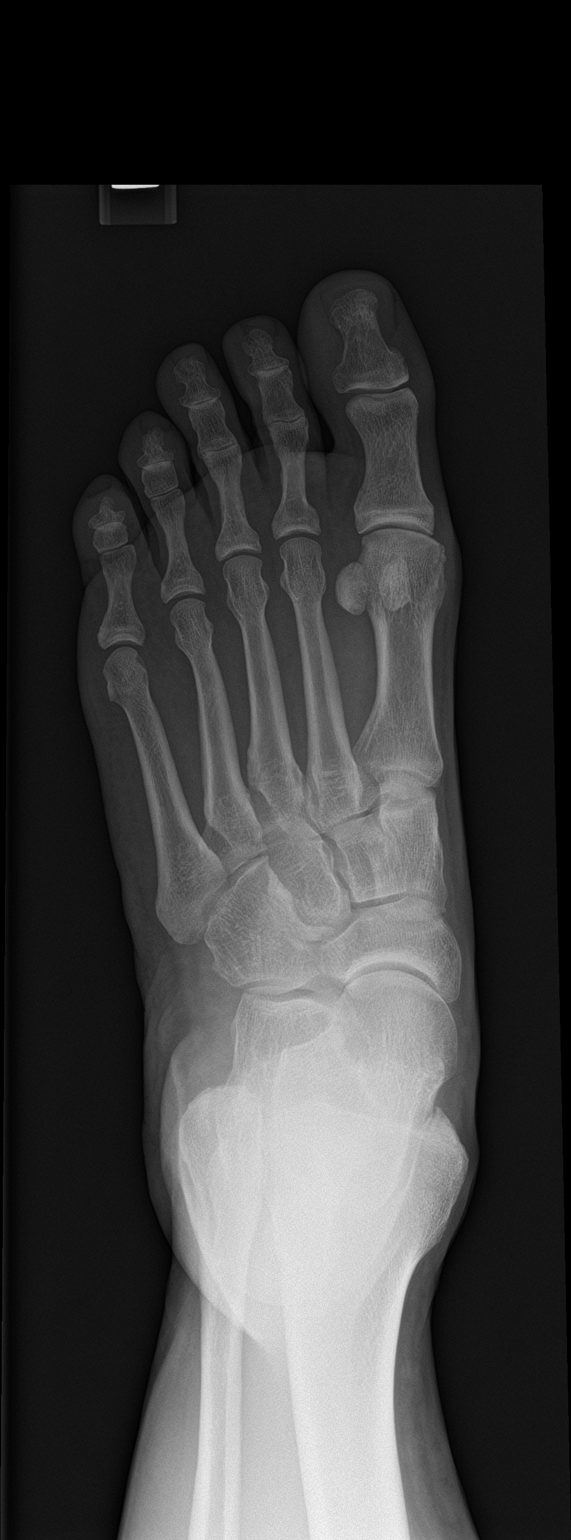

[foot lat]
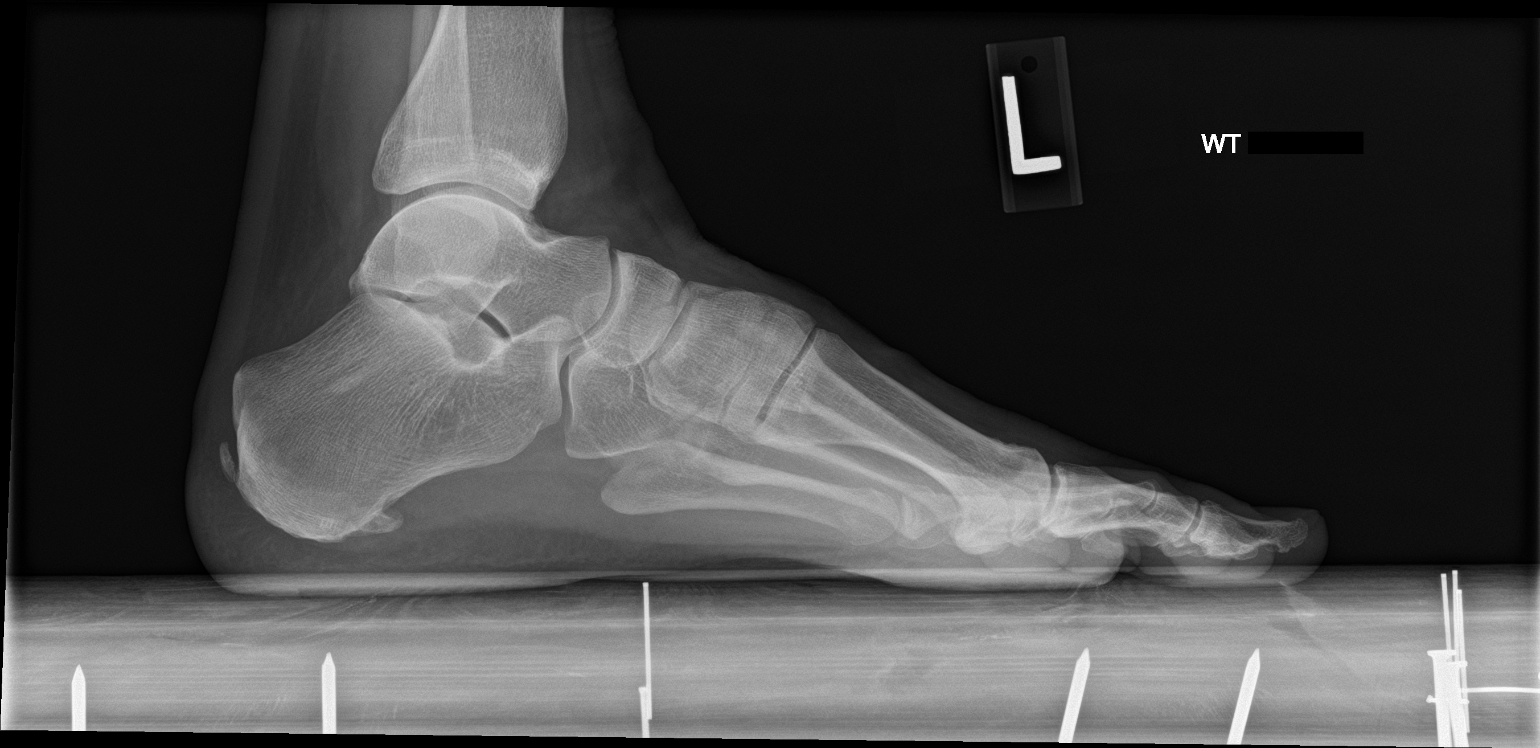

[3 of 3 positions shown; findings below may reference images not displayed]

FINDINGS: No acute fracture or dislocation. Minimal degenerative changes of
the first MTP. Enthesophyte of the Achilles tendon. Enthesophyte of
the plantar calcaneus. No area of erosion or osseous destruction. No
unexpected radiopaque foreign body. Soft tissues are unremarkable.
IMPRESSION: No acute osseous findings.

## 2021-12-24 DIAGNOSIS — H401123 Primary open-angle glaucoma, left eye, severe stage: Secondary | ICD-10-CM | POA: Diagnosis not present

## 2021-12-24 DIAGNOSIS — H2513 Age-related nuclear cataract, bilateral: Secondary | ICD-10-CM | POA: Diagnosis not present

## 2021-12-24 DIAGNOSIS — H401112 Primary open-angle glaucoma, right eye, moderate stage: Secondary | ICD-10-CM | POA: Diagnosis not present

## 2021-12-24 DIAGNOSIS — Z9883 Filtering (vitreous) bleb after glaucoma surgery status: Secondary | ICD-10-CM | POA: Diagnosis not present

## 2022-04-23 DIAGNOSIS — H401123 Primary open-angle glaucoma, left eye, severe stage: Secondary | ICD-10-CM | POA: Diagnosis not present

## 2022-04-23 DIAGNOSIS — Z9883 Filtering (vitreous) bleb after glaucoma surgery status: Secondary | ICD-10-CM | POA: Diagnosis not present

## 2022-04-23 DIAGNOSIS — H2513 Age-related nuclear cataract, bilateral: Secondary | ICD-10-CM | POA: Diagnosis not present

## 2022-04-23 DIAGNOSIS — H401112 Primary open-angle glaucoma, right eye, moderate stage: Secondary | ICD-10-CM | POA: Diagnosis not present

## 2022-04-23 DIAGNOSIS — Z79899 Other long term (current) drug therapy: Secondary | ICD-10-CM | POA: Diagnosis not present

## 2022-08-11 DIAGNOSIS — Z125 Encounter for screening for malignant neoplasm of prostate: Secondary | ICD-10-CM | POA: Diagnosis not present

## 2022-08-11 DIAGNOSIS — R7303 Prediabetes: Secondary | ICD-10-CM | POA: Diagnosis not present

## 2022-08-11 DIAGNOSIS — Z1159 Encounter for screening for other viral diseases: Secondary | ICD-10-CM | POA: Diagnosis not present

## 2022-08-11 DIAGNOSIS — Z23 Encounter for immunization: Secondary | ICD-10-CM | POA: Diagnosis not present

## 2022-08-11 DIAGNOSIS — E785 Hyperlipidemia, unspecified: Secondary | ICD-10-CM | POA: Diagnosis not present

## 2022-08-11 DIAGNOSIS — Z1211 Encounter for screening for malignant neoplasm of colon: Secondary | ICD-10-CM | POA: Diagnosis not present

## 2022-08-11 DIAGNOSIS — R030 Elevated blood-pressure reading, without diagnosis of hypertension: Secondary | ICD-10-CM | POA: Diagnosis not present

## 2022-08-11 DIAGNOSIS — Z0000 Encounter for general adult medical examination without abnormal findings: Secondary | ICD-10-CM | POA: Diagnosis not present

## 2022-08-30 DIAGNOSIS — H401112 Primary open-angle glaucoma, right eye, moderate stage: Secondary | ICD-10-CM | POA: Diagnosis not present

## 2022-08-30 DIAGNOSIS — H401123 Primary open-angle glaucoma, left eye, severe stage: Secondary | ICD-10-CM | POA: Diagnosis not present

## 2022-08-30 DIAGNOSIS — H2513 Age-related nuclear cataract, bilateral: Secondary | ICD-10-CM | POA: Diagnosis not present

## 2022-08-30 DIAGNOSIS — Z978 Presence of other specified devices: Secondary | ICD-10-CM | POA: Diagnosis not present

## 2022-08-30 DIAGNOSIS — Z79899 Other long term (current) drug therapy: Secondary | ICD-10-CM | POA: Diagnosis not present

## 2022-08-30 DIAGNOSIS — Z9883 Filtering (vitreous) bleb after glaucoma surgery status: Secondary | ICD-10-CM | POA: Diagnosis not present

## 2022-11-09 DIAGNOSIS — D72829 Elevated white blood cell count, unspecified: Secondary | ICD-10-CM | POA: Diagnosis not present

## 2022-11-09 DIAGNOSIS — I498 Other specified cardiac arrhythmias: Secondary | ICD-10-CM | POA: Diagnosis not present

## 2022-11-09 DIAGNOSIS — N132 Hydronephrosis with renal and ureteral calculous obstruction: Secondary | ICD-10-CM | POA: Diagnosis not present

## 2022-11-09 DIAGNOSIS — R739 Hyperglycemia, unspecified: Secondary | ICD-10-CM | POA: Diagnosis not present

## 2022-11-09 DIAGNOSIS — E871 Hypo-osmolality and hyponatremia: Secondary | ICD-10-CM | POA: Diagnosis not present

## 2022-11-09 DIAGNOSIS — R911 Solitary pulmonary nodule: Secondary | ICD-10-CM | POA: Diagnosis not present

## 2022-11-09 DIAGNOSIS — R109 Unspecified abdominal pain: Secondary | ICD-10-CM | POA: Diagnosis not present

## 2022-11-09 DIAGNOSIS — N201 Calculus of ureter: Secondary | ICD-10-CM | POA: Diagnosis not present

## 2022-11-09 DIAGNOSIS — R1032 Left lower quadrant pain: Secondary | ICD-10-CM | POA: Diagnosis not present

## 2022-11-09 DIAGNOSIS — R000 Tachycardia, unspecified: Secondary | ICD-10-CM | POA: Diagnosis not present

## 2022-11-09 DIAGNOSIS — Z87442 Personal history of urinary calculi: Secondary | ICD-10-CM | POA: Diagnosis not present

## 2022-11-09 DIAGNOSIS — N1330 Unspecified hydronephrosis: Secondary | ICD-10-CM | POA: Diagnosis not present

## 2022-11-24 DIAGNOSIS — N200 Calculus of kidney: Secondary | ICD-10-CM | POA: Diagnosis not present

## 2022-11-24 DIAGNOSIS — R319 Hematuria, unspecified: Secondary | ICD-10-CM | POA: Diagnosis not present

## 2022-11-24 DIAGNOSIS — R81 Glycosuria: Secondary | ICD-10-CM | POA: Diagnosis not present

## 2022-11-24 DIAGNOSIS — R800 Isolated proteinuria: Secondary | ICD-10-CM | POA: Diagnosis not present

## 2022-11-24 DIAGNOSIS — N201 Calculus of ureter: Secondary | ICD-10-CM | POA: Diagnosis not present

## 2022-12-17 DIAGNOSIS — R81 Glycosuria: Secondary | ICD-10-CM | POA: Diagnosis not present

## 2022-12-17 DIAGNOSIS — N200 Calculus of kidney: Secondary | ICD-10-CM | POA: Diagnosis not present

## 2022-12-17 DIAGNOSIS — N2889 Other specified disorders of kidney and ureter: Secondary | ICD-10-CM | POA: Diagnosis not present

## 2023-01-06 DIAGNOSIS — H401112 Primary open-angle glaucoma, right eye, moderate stage: Secondary | ICD-10-CM | POA: Diagnosis not present

## 2023-01-06 DIAGNOSIS — H2513 Age-related nuclear cataract, bilateral: Secondary | ICD-10-CM | POA: Diagnosis not present

## 2023-01-06 DIAGNOSIS — Z9689 Presence of other specified functional implants: Secondary | ICD-10-CM | POA: Diagnosis not present

## 2023-01-06 DIAGNOSIS — H401123 Primary open-angle glaucoma, left eye, severe stage: Secondary | ICD-10-CM | POA: Diagnosis not present

## 2023-01-24 DIAGNOSIS — E785 Hyperlipidemia, unspecified: Secondary | ICD-10-CM | POA: Diagnosis not present

## 2023-01-24 DIAGNOSIS — E1169 Type 2 diabetes mellitus with other specified complication: Secondary | ICD-10-CM | POA: Diagnosis not present

## 2023-03-02 DIAGNOSIS — M549 Dorsalgia, unspecified: Secondary | ICD-10-CM | POA: Diagnosis not present

## 2023-03-02 DIAGNOSIS — R030 Elevated blood-pressure reading, without diagnosis of hypertension: Secondary | ICD-10-CM | POA: Diagnosis not present

## 2023-03-03 DIAGNOSIS — N390 Urinary tract infection, site not specified: Secondary | ICD-10-CM | POA: Diagnosis not present

## 2023-03-07 DIAGNOSIS — R911 Solitary pulmonary nodule: Secondary | ICD-10-CM | POA: Diagnosis not present

## 2023-03-07 DIAGNOSIS — N200 Calculus of kidney: Secondary | ICD-10-CM | POA: Diagnosis not present

## 2023-03-07 DIAGNOSIS — N433 Hydrocele, unspecified: Secondary | ICD-10-CM | POA: Diagnosis not present

## 2023-04-20 DIAGNOSIS — R69 Illness, unspecified: Secondary | ICD-10-CM | POA: Diagnosis not present

## 2023-06-06 DIAGNOSIS — H04129 Dry eye syndrome of unspecified lacrimal gland: Secondary | ICD-10-CM | POA: Diagnosis not present

## 2023-06-06 DIAGNOSIS — H04123 Dry eye syndrome of bilateral lacrimal glands: Secondary | ICD-10-CM | POA: Diagnosis not present

## 2023-06-06 DIAGNOSIS — H401112 Primary open-angle glaucoma, right eye, moderate stage: Secondary | ICD-10-CM | POA: Diagnosis not present

## 2023-06-06 DIAGNOSIS — H401123 Primary open-angle glaucoma, left eye, severe stage: Secondary | ICD-10-CM | POA: Diagnosis not present

## 2023-06-06 DIAGNOSIS — H2512 Age-related nuclear cataract, left eye: Secondary | ICD-10-CM | POA: Diagnosis not present

## 2023-06-06 DIAGNOSIS — H25811 Combined forms of age-related cataract, right eye: Secondary | ICD-10-CM | POA: Diagnosis not present

## 2023-06-30 DIAGNOSIS — H401123 Primary open-angle glaucoma, left eye, severe stage: Secondary | ICD-10-CM | POA: Diagnosis not present

## 2023-06-30 DIAGNOSIS — H401112 Primary open-angle glaucoma, right eye, moderate stage: Secondary | ICD-10-CM | POA: Diagnosis not present

## 2023-06-30 DIAGNOSIS — H2513 Age-related nuclear cataract, bilateral: Secondary | ICD-10-CM | POA: Diagnosis not present

## 2023-06-30 DIAGNOSIS — Z9689 Presence of other specified functional implants: Secondary | ICD-10-CM | POA: Diagnosis not present

## 2023-07-06 DIAGNOSIS — H6993 Unspecified Eustachian tube disorder, bilateral: Secondary | ICD-10-CM | POA: Diagnosis not present

## 2023-07-06 DIAGNOSIS — J018 Other acute sinusitis: Secondary | ICD-10-CM | POA: Diagnosis not present

## 2023-08-22 DIAGNOSIS — H2513 Age-related nuclear cataract, bilateral: Secondary | ICD-10-CM | POA: Diagnosis not present

## 2023-08-22 DIAGNOSIS — H524 Presbyopia: Secondary | ICD-10-CM | POA: Diagnosis not present

## 2023-08-30 DIAGNOSIS — H401112 Primary open-angle glaucoma, right eye, moderate stage: Secondary | ICD-10-CM | POA: Diagnosis not present

## 2023-08-30 DIAGNOSIS — Z9689 Presence of other specified functional implants: Secondary | ICD-10-CM | POA: Diagnosis not present

## 2023-08-30 DIAGNOSIS — H401123 Primary open-angle glaucoma, left eye, severe stage: Secondary | ICD-10-CM | POA: Diagnosis not present

## 2023-08-30 DIAGNOSIS — H2513 Age-related nuclear cataract, bilateral: Secondary | ICD-10-CM | POA: Diagnosis not present

## 2023-08-31 DIAGNOSIS — K4090 Unilateral inguinal hernia, without obstruction or gangrene, not specified as recurrent: Secondary | ICD-10-CM | POA: Diagnosis not present

## 2023-08-31 DIAGNOSIS — R1031 Right lower quadrant pain: Secondary | ICD-10-CM | POA: Diagnosis not present

## 2023-08-31 DIAGNOSIS — R1030 Lower abdominal pain, unspecified: Secondary | ICD-10-CM | POA: Diagnosis not present

## 2023-08-31 DIAGNOSIS — R17 Unspecified jaundice: Secondary | ICD-10-CM | POA: Diagnosis not present

## 2023-08-31 DIAGNOSIS — R1032 Left lower quadrant pain: Secondary | ICD-10-CM | POA: Diagnosis not present

## 2023-09-06 ENCOUNTER — Other Ambulatory Visit (HOSPITAL_COMMUNITY): Payer: Self-pay | Admitting: Family Medicine

## 2023-09-06 DIAGNOSIS — E785 Hyperlipidemia, unspecified: Secondary | ICD-10-CM

## 2023-09-13 ENCOUNTER — Ambulatory Visit (HOSPITAL_COMMUNITY)
Admission: RE | Admit: 2023-09-13 | Discharge: 2023-09-13 | Disposition: A | Discharge status: Home / Self Care | Payer: Medicare HMO | Source: Ambulatory Visit | Attending: Family Medicine | Admitting: Family Medicine

## 2023-09-13 DIAGNOSIS — E785 Hyperlipidemia, unspecified: Secondary | ICD-10-CM | POA: Insufficient documentation

## 2023-09-19 DIAGNOSIS — K469 Unspecified abdominal hernia without obstruction or gangrene: Secondary | ICD-10-CM | POA: Diagnosis not present

## 2023-09-19 DIAGNOSIS — K4020 Bilateral inguinal hernia, without obstruction or gangrene, not specified as recurrent: Secondary | ICD-10-CM | POA: Diagnosis not present

## 2023-10-06 DIAGNOSIS — K4091 Unilateral inguinal hernia, without obstruction or gangrene, recurrent: Secondary | ICD-10-CM | POA: Diagnosis not present

## 2023-10-06 DIAGNOSIS — K4090 Unilateral inguinal hernia, without obstruction or gangrene, not specified as recurrent: Secondary | ICD-10-CM | POA: Diagnosis not present

## 2023-10-06 DIAGNOSIS — D176 Benign lipomatous neoplasm of spermatic cord: Secondary | ICD-10-CM | POA: Diagnosis not present

## 2023-10-06 DIAGNOSIS — K432 Incisional hernia without obstruction or gangrene: Secondary | ICD-10-CM | POA: Diagnosis not present

## 2023-11-09 DIAGNOSIS — K4020 Bilateral inguinal hernia, without obstruction or gangrene, not specified as recurrent: Secondary | ICD-10-CM | POA: Diagnosis not present

## 2023-11-15 DIAGNOSIS — K4020 Bilateral inguinal hernia, without obstruction or gangrene, not specified as recurrent: Secondary | ICD-10-CM | POA: Diagnosis not present

## 2023-11-15 DIAGNOSIS — N281 Cyst of kidney, acquired: Secondary | ICD-10-CM | POA: Diagnosis not present

## 2023-12-07 DIAGNOSIS — K4091 Unilateral inguinal hernia, without obstruction or gangrene, recurrent: Secondary | ICD-10-CM | POA: Diagnosis not present

## 2023-12-08 ENCOUNTER — Ambulatory Visit: Payer: Self-pay | Admitting: General Surgery

## 2023-12-08 DIAGNOSIS — E785 Hyperlipidemia, unspecified: Secondary | ICD-10-CM | POA: Diagnosis not present

## 2023-12-08 NOTE — Patient Instructions (Addendum)
 SURGICAL WAITING ROOM VISITATION  Patients having surgery or a procedure may have no more than 2 support people in the waiting area - these visitors may rotate.    Children under the age of 11 must have an adult with them who is not the patient.  Due to an increase in RSV and influenza rates and associated hospitalizations, children ages 12 and under may not visit patients in [facility] hospitals.  Visitors with respiratory illnesses are discouraged from visiting and should remain at home.  If the patient needs to stay at the hospital during part of their recovery, the visitor guidelines for inpatient rooms apply. Pre-op nurse will coordinate an appropriate time for 1 support person to accompany patient in pre-op.  This support person may not rotate.    Please refer to the [facility] website for the visitor guidelines for Inpatients (after your surgery is over and you are in a regular room).       Your procedure is scheduled on: 12/19/23   Report to [facility] Main Entrance    Report to admitting at 9:30 AM   Call this number if you have problems the morning of surgery [phone]   Do not eat food :After Midnight.   After Midnight you may have the following liquids until 8:45 AM DAY OF SURGERY  Water  Non-Citrus Juices (without pulp, NO RED-Apple, White grape, White cranberry) Black Coffee (NO MILK/CREAM OR CREAMERS, sugar ok)  Clear Tea (NO MILK/CREAM OR CREAMERS, sugar ok) regular and decaf                             Plain Jell-O (NO RED)                                           Fruit ices (not with fruit pulp, NO RED)                                     Popsicles (NO RED)                                                               Sports drinks like Gatorade (NO RED)                  The day of surgery:  Drink ONE (1) Pre-Surgery G2 at 8:45 AM the morning of surgery. Drink in one sitting. Do not sip.  This drink was given to you during your hospital   pre-op appointment visit. Nothing else to drink after completing the  Pre-Surgery Clear G2.   Oral Hygiene is also important to reduce your risk of infection.                                    Remember - BRUSH YOUR TEETH THE MORNING OF SURGERY WITH YOUR REGULAR TOOTHPASTE   Stop all vitamins and herbal supplements 7 days before surgery.   Take these medicines the morning of surgery with A  SIP OF WATER : pantoprazole, tylenol , xanax, oxycodone  if needed.  DO NOT TAKE ANY ORAL DIABETIC MEDICATIONS DAY OF YOUR SURGERY Hold Metformin the morning of surgery.             You may not have any metal on your body including hair pins, jewelry, and body piercing             Do not wear make-up, lotions, powders, perfumes/cologne, or deodorant              Men may shave face and neck.   Do not bring valuables to the hospital. [facility] IS NOT             RESPONSIBLE   FOR VALUABLES.   Contacts, glasses, dentures or bridgework may not be worn into surgery.  DO NOT BRING YOUR HOME MEDICATIONS TO THE HOSPITAL. PHARMACY WILL DISPENSE MEDICATIONS LISTED ON YOUR MEDICATION LIST TO YOU DURING YOUR ADMISSION IN THE HOSPITAL!    Patients discharged on the day of surgery will not be allowed to drive home.  Someone NEEDS to stay with you for the first 24 hours after anesthesia.   Special Instructions: Bring a copy of your healthcare power of attorney and living will documents the day of surgery if you haven't scanned them before.              Please read over the following fact sheets you were given: IF YOU HAVE QUESTIONS ABOUT YOUR PRE-OP INSTRUCTIONS PLEASE CALL [phone] [facility]   If you received a COVID test during your pre-op visit  it is requested that you wear a mask when out in public, stay away from anyone that may not be feeling well and notify your surgeon if you develop symptoms. If you test positive for Covid or have been in contact with anyone that has tested positive in the last 10  days please notify you surgeon.    [facility] - Preparing for Surgery Before surgery, you can play an important role.  Because skin is not sterile, your skin needs to be as free of germs as possible.  You can reduce the number of germs on your skin by washing with CHG (chlorahexidine gluconate) soap before surgery.  CHG is an antiseptic cleaner which kills germs and bonds with the skin to continue killing germs even after washing. Please DO NOT use if you have an allergy to CHG or antibacterial soaps.  If your skin becomes reddened/irritated stop using the CHG and inform your nurse when you arrive at Short Stay. Do not shave (including legs and underarms) for at least 48 hours prior to the first CHG shower.  You may shave your face/neck.  Please follow these instructions carefully:  1.  Shower with CHG Soap the night before surgery and the  morning of surgery.  2.  If you choose to wash your hair, wash your hair first as usual with your normal  shampoo.  3.  After you shampoo, rinse your hair and body thoroughly to remove the shampoo.                             4.  Use CHG as you would any other liquid soap.  You can apply chg directly to the skin and wash.  Gently with a scrungie or clean washcloth.  5.  Apply the CHG Soap to your body ONLY FROM THE NECK DOWN.   Do   not  use on face/ open                           Wound or open sores. Avoid contact with eyes, ears mouth and   genitals (private parts).                       Wash face,  Genitals (private parts) with your normal soap.             6.  Wash thoroughly, paying special attention to the area where your    surgery  will be performed.  7.  Thoroughly rinse your body with warm water  from the neck down.  8.  DO NOT shower/wash with your normal soap after using and rinsing off the CHG Soap.                9.  Pat yourself dry with a clean towel.            10.  Wear clean pajamas.            11.  Place clean sheets on your bed the night of  your first shower and do not  sleep with pets. Day of Surgery : Do not apply any lotions/deodorants the morning of surgery.  Please wear clean clothes to the hospital/surgery center.  FAILURE TO FOLLOW THESE INSTRUCTIONS MAY RESULT IN THE CANCELLATION OF YOUR SURGERY  PATIENT SIGNATURE_________________________________  NURSE SIGNATURE__________________________________  ________________________________________________________________________

## 2023-12-08 NOTE — Progress Notes (Addendum)
 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 [x]  No []  Yes   CPAP used?- [x]  No []  Yes    Does the patient monitor blood sugar?          []  No [x]  Yes  []  N/A  Patient has: []  NO Hx DM   []  Pre-DM                 []  DM1  [x]   DM2 Does patient have a [employer] or Dexacom? [x]  No []  Yes   Fasting Blood Sugar Ranges- 160'S Checks Blood Sugar __1___ times a day  GLP1 agonist / usual dose - NO GLP1 instructions:  SGLT-2 inhibitors / usual dose - NO SGLT-2 instructions:   Blood Thinner / Instructions:NO No ASA either Comments:   Activity level: Patient is able to climb a flight of stairs without difficulty; [x]  No CP  [x]  No SOB, __   Patient can perform ADLs without assistance.   Anesthesia review:   Patient denies shortness of breath, fever, cough and chest pain at PAT appointment.  Patient verbalized understanding and agreement to the Pre-Surgical Instructions that were given to them at this PAT appointment. Patient was also educated of the need to review these PAT instructions again prior to his/her surgery.I reviewed the appropriate phone numbers to call if they have any and questions or concerns.

## 2023-12-08 NOTE — Progress Notes (Signed)
 Please send pre op orders for PST appointment 12/09/23.

## 2023-12-09 ENCOUNTER — Encounter (HOSPITAL_COMMUNITY)
Admission: RE | Admit: 2023-12-09 | Discharge: 2023-12-09 | Disposition: A | Discharge status: Home / Self Care | Payer: Medicare HMO | Source: Ambulatory Visit | Attending: General Surgery | Admitting: General Surgery

## 2023-12-09 ENCOUNTER — Other Ambulatory Visit: Payer: Self-pay

## 2023-12-09 ENCOUNTER — Encounter (HOSPITAL_COMMUNITY): Payer: Self-pay

## 2023-12-09 VITALS — BP 149/83 | HR 70 | Temp 98.0°F | Resp 16 | Ht 68.0 in | Wt 177.0 lb

## 2023-12-09 DIAGNOSIS — Z01818 Encounter for other preprocedural examination: Secondary | ICD-10-CM | POA: Diagnosis present

## 2023-12-09 DIAGNOSIS — Z01812 Encounter for preprocedural laboratory examination: Secondary | ICD-10-CM | POA: Diagnosis not present

## 2023-12-09 HISTORY — DX: Personal history of urinary calculi: Z87.442

## 2023-12-09 HISTORY — DX: Type 2 diabetes mellitus without complications: E11.9

## 2023-12-09 LAB — HEMOGLOBIN A1C
Hgb A1c MFr Bld: 6.5 % — ABNORMAL HIGH (ref 4.8–5.6)
Mean Plasma Glucose: 139.85 mg/dL

## 2023-12-09 LAB — BASIC METABOLIC PANEL
Anion gap: 9 (ref 5–15)
BUN: 13 mg/dL (ref 8–23)
CO2: 22 mmol/L (ref 22–32)
Calcium: 9.2 mg/dL (ref 8.9–10.3)
Chloride: 107 mmol/L (ref 98–111)
Creatinine, Ser: 0.76 mg/dL (ref 0.61–1.24)
GFR, Estimated: >60 mL/min (ref >60)
Glucose, Bld: 151 mg/dL — ABNORMAL HIGH (ref 70–99)
Potassium: 4.2 mmol/L (ref 3.5–5.1)
Sodium: 138 mmol/L (ref 135–145)

## 2023-12-09 LAB — CBC
HCT: 47.3 % (ref 39.0–52.0)
Hemoglobin: 16 g/dL (ref 13.0–17.0)
MCH: 29.6 pg (ref 26.0–34.0)
MCHC: 33.8 g/dL (ref 30.0–36.0)
MCV: 87.6 fL (ref 80.0–100.0)
Platelets: 357 10*3/uL (ref 150–400)
RBC: 5.4 MIL/uL (ref 4.22–5.81)
RDW: 12.9 % (ref 11.5–15.5)
WBC: 8.4 10*3/uL (ref 4.0–10.5)
nRBC: 0 % (ref 0.0–0.2)

## 2023-12-09 LAB — GLUCOSE, CAPILLARY: Glucose-Capillary: 153 mg/dL — ABNORMAL HIGH (ref 70–99)

## 2023-12-19 ENCOUNTER — Ambulatory Visit (HOSPITAL_COMMUNITY): Payer: Medicare HMO | Admitting: Certified Registered"

## 2023-12-19 ENCOUNTER — Ambulatory Visit (HOSPITAL_COMMUNITY)
Admission: RE | Admit: 2023-12-19 | Discharge: 2023-12-19 | Disposition: A | Discharge status: Home / Self Care | Payer: Medicare HMO | Source: Ambulatory Visit | Attending: General Surgery | Admitting: General Surgery

## 2023-12-19 ENCOUNTER — Encounter (HOSPITAL_COMMUNITY)
Admission: RE | Disposition: A | Discharge status: Home / Self Care | Payer: Self-pay | Source: Ambulatory Visit | Attending: General Surgery

## 2023-12-19 ENCOUNTER — Encounter (HOSPITAL_COMMUNITY): Payer: Self-pay | Admitting: General Surgery

## 2023-12-19 ENCOUNTER — Other Ambulatory Visit: Payer: Self-pay

## 2023-12-19 DIAGNOSIS — E119 Type 2 diabetes mellitus without complications: Secondary | ICD-10-CM | POA: Insufficient documentation

## 2023-12-19 DIAGNOSIS — Z01818 Encounter for other preprocedural examination: Secondary | ICD-10-CM

## 2023-12-19 DIAGNOSIS — Z7984 Long term (current) use of oral hypoglycemic drugs: Secondary | ICD-10-CM | POA: Insufficient documentation

## 2023-12-19 DIAGNOSIS — K4090 Unilateral inguinal hernia, without obstruction or gangrene, not specified as recurrent: Secondary | ICD-10-CM | POA: Diagnosis not present

## 2023-12-19 DIAGNOSIS — K4091 Unilateral inguinal hernia, without obstruction or gangrene, recurrent: Secondary | ICD-10-CM | POA: Diagnosis not present

## 2023-12-19 DIAGNOSIS — D175 Benign lipomatous neoplasm of intra-abdominal organs: Secondary | ICD-10-CM | POA: Diagnosis not present

## 2023-12-19 DIAGNOSIS — G8918 Other acute postprocedural pain: Secondary | ICD-10-CM | POA: Diagnosis not present

## 2023-12-19 HISTORY — PX: INGUINAL HERNIA REPAIR: SHX194

## 2023-12-19 LAB — GLUCOSE, CAPILLARY
Glucose-Capillary: 147 mg/dL — ABNORMAL HIGH (ref 70–99)
Glucose-Capillary: 168 mg/dL — ABNORMAL HIGH (ref 70–99)
Glucose-Capillary: 114 mg/dL — ABNORMAL HIGH (ref 70–99)

## 2023-12-19 SURGERY — REPAIR, HERNIA, INGUINAL, ADULT
Anesthesia: General | Laterality: Left

## 2023-12-19 MED ORDER — ROCURONIUM BROMIDE 10 MG/ML (PF) SYRINGE
PREFILLED_SYRINGE | INTRAVENOUS | Status: DC | PRN
  Administered 2023-12-19: 10 mg via INTRAVENOUS
  Administered 2023-12-19: 60 mg via INTRAVENOUS

## 2023-12-19 MED ORDER — MIDAZOLAM HCL 2 MG/2ML IJ SOLN
INTRAMUSCULAR | Status: AC
  Filled 2023-12-19: qty 2

## 2023-12-19 MED ORDER — BUPIVACAINE HCL (PF) 0.5 % IJ SOLN
INTRAMUSCULAR | Status: AC
  Filled 2023-12-19: qty 30

## 2023-12-19 MED ORDER — PROPOFOL 10 MG/ML IV BOLUS
INTRAVENOUS | Status: AC
  Filled 2023-12-19: qty 20

## 2023-12-19 MED ORDER — IBUPROFEN 800 MG PO TABS
800.0000 mg | ORAL_TABLET | Freq: Three times a day (TID) | ORAL | 0 refills | Status: AC | PRN
Start: 2023-12-19 — End: ?

## 2023-12-19 MED ORDER — FENTANYL CITRATE PF 50 MCG/ML IJ SOSY
50.0000 ug | PREFILLED_SYRINGE | Freq: Once | INTRAMUSCULAR | Status: AC
  Administered 2023-12-19: 50 ug via INTRAVENOUS

## 2023-12-19 MED ORDER — DEXAMETHASONE SODIUM PHOSPHATE 10 MG/ML IJ SOLN
INTRAMUSCULAR | Status: DC | PRN
  Administered 2023-12-19: 10 mg via INTRAVENOUS

## 2023-12-19 MED ORDER — BUPIVACAINE HCL (PF) 0.25 % IJ SOLN
INTRAMUSCULAR | Status: DC | PRN
  Administered 2023-12-19: 20 mL via PERINEURAL

## 2023-12-19 MED ORDER — CHLORHEXIDINE GLUCONATE CLOTH 2 % EX PADS: 6.0000 | MEDICATED_PAD | Freq: Once | CUTANEOUS | Status: DC

## 2023-12-19 MED ORDER — LIDOCAINE 2% (20 MG/ML) 5 ML SYRINGE
INTRAMUSCULAR | Status: DC | PRN
  Administered 2023-12-19: 80 mg via INTRAVENOUS

## 2023-12-19 MED ORDER — ONDANSETRON HCL 4 MG/2ML IJ SOLN
INTRAMUSCULAR | Status: AC
  Filled 2023-12-19: qty 2

## 2023-12-19 MED ORDER — FENTANYL CITRATE PF 50 MCG/ML IJ SOSY
PREFILLED_SYRINGE | INTRAMUSCULAR | Status: AC
  Filled 2023-12-19: qty 2

## 2023-12-19 MED ORDER — PROPOFOL 10 MG/ML IV BOLUS
INTRAVENOUS | Status: DC | PRN
  Administered 2023-12-19: 150 mg via INTRAVENOUS

## 2023-12-19 MED ORDER — FENTANYL CITRATE PF 50 MCG/ML IJ SOSY
PREFILLED_SYRINGE | INTRAMUSCULAR | Status: AC
  Filled 2023-12-19: qty 1

## 2023-12-19 MED ORDER — MIDAZOLAM HCL 5 MG/5ML IJ SOLN
INTRAMUSCULAR | Status: DC | PRN
  Administered 2023-12-19: 2 mg via INTRAVENOUS

## 2023-12-19 MED ORDER — BUPIVACAINE LIPOSOME 1.3 % IJ SUSP
INTRAMUSCULAR | Status: DC | PRN
  Administered 2023-12-19: 10 mL via PERINEURAL

## 2023-12-19 MED ORDER — MIDAZOLAM HCL 2 MG/2ML IJ SOLN
1.0000 mg | Freq: Once | INTRAMUSCULAR | Status: AC
  Administered 2023-12-19: 2 mg via INTRAVENOUS

## 2023-12-19 MED ORDER — BUPIVACAINE HCL 0.5 % IJ SOLN
INTRAMUSCULAR | Status: DC | PRN
  Administered 2023-12-19: 10 mL

## 2023-12-19 MED ORDER — INSULIN ASPART 100 UNIT/ML IJ SOLN
0.0000 [IU] | INTRAMUSCULAR | Status: DC | PRN
  Administered 2023-12-19: 2 [IU] via SUBCUTANEOUS

## 2023-12-19 MED ORDER — FENTANYL CITRATE PF 50 MCG/ML IJ SOSY
25.0000 ug | PREFILLED_SYRINGE | INTRAMUSCULAR | Status: DC | PRN
  Administered 2023-12-19 (×3): 25 ug via INTRAVENOUS

## 2023-12-19 MED ORDER — ONDANSETRON HCL 4 MG/2ML IJ SOLN
INTRAMUSCULAR | Status: DC | PRN
  Administered 2023-12-19: 4 mg via INTRAVENOUS

## 2023-12-19 MED ORDER — ACETAMINOPHEN 500 MG PO TABS
1000.0000 mg | ORAL_TABLET | ORAL | Status: AC
  Administered 2023-12-19: 1000 mg via ORAL
  Filled 2023-12-19: qty 2

## 2023-12-19 MED ORDER — DEXAMETHASONE SODIUM PHOSPHATE 10 MG/ML IJ SOLN
INTRAMUSCULAR | Status: AC
  Filled 2023-12-19: qty 1

## 2023-12-19 MED ORDER — STERILE WATER FOR IRRIGATION IR SOLN
Status: DC | PRN
  Administered 2023-12-19: 1000 mL

## 2023-12-19 MED ORDER — OXYCODONE HCL 5 MG PO TABS
5.0000 mg | ORAL_TABLET | Freq: Four times a day (QID) | ORAL | 0 refills | Status: AC | PRN
Start: 2023-12-19 — End: ?

## 2023-12-19 MED ORDER — FENTANYL CITRATE (PF) 100 MCG/2ML IJ SOLN
INTRAMUSCULAR | Status: DC | PRN
  Administered 2023-12-19 (×2): 50 ug via INTRAVENOUS

## 2023-12-19 MED ORDER — PHENYLEPHRINE 80 MCG/ML (10ML) SYRINGE FOR IV PUSH (FOR BLOOD PRESSURE SUPPORT)
PREFILLED_SYRINGE | INTRAVENOUS | Status: DC | PRN
  Administered 2023-12-19: 160 ug via INTRAVENOUS
  Administered 2023-12-19: 80 ug via INTRAVENOUS
  Administered 2023-12-19: 160 ug via INTRAVENOUS

## 2023-12-19 MED ORDER — FENTANYL CITRATE (PF) 100 MCG/2ML IJ SOLN
INTRAMUSCULAR | Status: AC
  Filled 2023-12-19: qty 2

## 2023-12-19 MED ORDER — SUGAMMADEX SODIUM 200 MG/2ML IV SOLN
INTRAVENOUS | Status: DC | PRN
  Administered 2023-12-19: 200 mg via INTRAVENOUS

## 2023-12-19 MED ORDER — ORAL CARE MOUTH RINSE: 15.0000 mL | Freq: Once | OROMUCOSAL | Status: AC

## 2023-12-19 MED ORDER — CEFAZOLIN SODIUM-DEXTROSE 2-4 GM/100ML-% IV SOLN
2.0000 g | INTRAVENOUS | Status: AC
  Administered 2023-12-19: 2 g via INTRAVENOUS
  Filled 2023-12-19: qty 100

## 2023-12-19 MED ORDER — ENSURE PRE-SURGERY PO LIQD
296.0000 mL | Freq: Once | ORAL | Status: DC
  Filled 2023-12-19: qty 296

## 2023-12-19 MED ORDER — LACTATED RINGERS IV SOLN: INTRAVENOUS | Status: DC

## 2023-12-19 MED ORDER — CHLORHEXIDINE GLUCONATE 0.12 % MT SOLN
15.0000 mL | Freq: Once | OROMUCOSAL | Status: AC
  Administered 2023-12-19: 15 mL via OROMUCOSAL

## 2023-12-19 MED ORDER — CHLORHEXIDINE GLUCONATE CLOTH 2 % EX PADS
6.0000 | MEDICATED_PAD | Freq: Once | CUTANEOUS | Status: DC
Start: 2023-12-19 — End: 2023-12-19

## 2023-12-19 MED ORDER — ONDANSETRON HCL 4 MG/2ML IJ SOLN: 4.0000 mg | Freq: Once | INTRAMUSCULAR | Status: DC | PRN

## 2023-12-19 MED ORDER — PHENYLEPHRINE 80 MCG/ML (10ML) SYRINGE FOR IV PUSH (FOR BLOOD PRESSURE SUPPORT)
PREFILLED_SYRINGE | INTRAVENOUS | Status: AC
  Filled 2023-12-19: qty 10

## 2023-12-19 MED ORDER — 0.9 % SODIUM CHLORIDE (POUR BTL) OPTIME
TOPICAL | Status: DC | PRN
  Administered 2023-12-19: 1000 mL

## 2023-12-19 SURGICAL SUPPLY — 37 items
BAG COUNTER SPONGE SURGICOUNT (BAG) ×1 IMPLANT
BENZOIN TINCTURE PRP APPL 2/3 (GAUZE/BANDAGES/DRESSINGS) IMPLANT
BLADE SURG 15 STRL LF DISP TIS (BLADE) ×1 IMPLANT
CHLORAPREP W/TINT 26 (MISCELLANEOUS) ×1 IMPLANT
COVER SURGICAL LIGHT HANDLE (MISCELLANEOUS) ×1 IMPLANT
DERMABOND ADVANCED .7 DNX12 (GAUZE/BANDAGES/DRESSINGS) ×1 IMPLANT
DRAIN PENROSE 0.5X18 (DRAIN) IMPLANT
DRAPE LAPAROTOMY TRNSV 102X78 (DRAPES) ×1 IMPLANT
DRAPE UTILITY XL STRL (DRAPES) ×1 IMPLANT
DRSG TELFA PLUS 4X6 ADH ISLAND (GAUZE/BANDAGES/DRESSINGS) IMPLANT
ELECT REM PT RETURN 15FT ADLT (MISCELLANEOUS) ×1 IMPLANT
GAUZE SPONGE 4X4 12PLY STRL (GAUZE/BANDAGES/DRESSINGS) IMPLANT
GLOVE BIOGEL PI IND STRL 7.0 (GLOVE) IMPLANT
GLOVE SURG SS PI 7.0 STRL IVOR (GLOVE) ×1 IMPLANT
GOWN STRL REUS W/ TWL LRG LVL3 (GOWN DISPOSABLE) ×1 IMPLANT
GOWN STRL REUS W/ TWL XL LVL3 (GOWN DISPOSABLE) IMPLANT
KIT BASIN OR (CUSTOM PROCEDURE TRAY) ×1 IMPLANT
KIT TURNOVER KIT A (KITS) IMPLANT
MARKER SKIN DUAL TIP RULER LAB (MISCELLANEOUS) ×1 IMPLANT
MESH HERNIA 3X6 (Mesh General) IMPLANT
NDL HYPO 22X1.5 SAFETY MO (MISCELLANEOUS) ×1 IMPLANT
NEEDLE HYPO 22X1.5 SAFETY MO (MISCELLANEOUS) ×1 IMPLANT
PACK BASIC VI WITH GOWN DISP (CUSTOM PROCEDURE TRAY) ×1 IMPLANT
PENCIL SMOKE EVACUATOR (MISCELLANEOUS) ×1 IMPLANT
SPIKE FLUID TRANSFER (MISCELLANEOUS) ×1 IMPLANT
SPONGE T-LAP 4X18 ~~LOC~~+RFID (SPONGE) ×1 IMPLANT
STRIP CLOSURE SKIN 1/2X4 (GAUZE/BANDAGES/DRESSINGS) IMPLANT
SUT MNCRL AB 4-0 PS2 18 (SUTURE) ×1 IMPLANT
SUT PDS AB 2-0 CT2 27 (SUTURE) ×1 IMPLANT
SUT PROLENE 2 0 CT2 30 (SUTURE) ×2 IMPLANT
SUT VIC AB 3-0 SH 18 (SUTURE) IMPLANT
SUT VIC AB 3-0 SH 27XBRD (SUTURE) IMPLANT
SUT VICRYL 3-0 CR8 SH (SUTURE) ×1 IMPLANT
SYR BULB IRRIG 60ML STRL (SYRINGE) ×1 IMPLANT
SYR CONTROL 10ML LL (SYRINGE) ×1 IMPLANT
TOWEL OR 17X26 10 PK STRL BLUE (TOWEL DISPOSABLE) ×1 IMPLANT
YANKAUER SUCT BULB TIP 10FT TU (MISCELLANEOUS) IMPLANT

## 2023-12-19 NOTE — Anesthesia Preprocedure Evaluation (Signed)
 Anesthesia Evaluation  Patient identified by MRN, date of birth, ID band Patient awake    Reviewed: Allergy & Precautions, NPO status , Patient's Chart, lab work & pertinent test results  Airway Mallampati: II  TM Distance: >3 FB Neck ROM: Full    Dental  (+) Teeth Intact, Dental Advisory Given   Pulmonary neg pulmonary ROS   Pulmonary exam normal breath sounds clear to auscultation       Cardiovascular negative cardio ROS Normal cardiovascular exam Rhythm:Regular Rate:Normal     Neuro/Psych negative neurological ROS  negative psych ROS   GI/Hepatic Neg liver ROS,,,LEFT RECURRENT INGUINA HERNIA   Endo/Other  diabetes, Type 2, Oral Hypoglycemic Agents    Renal/GU negative Renal ROS     Musculoskeletal negative musculoskeletal ROS (+)    Abdominal   Peds  Hematology negative hematology ROS (+)   Anesthesia Other Findings Day of surgery medications reviewed with the patient.  Reproductive/Obstetrics                             Anesthesia Physical Anesthesia Plan  ASA: 2  Anesthesia Plan: General   Post-op Pain Management: Regional block* and Tylenol PO (pre-op)*   Induction: Intravenous  PONV Risk Score and Plan: 3 and Midazolam, Dexamethasone and Ondansetron  Airway Management Planned: Oral ETT  Additional Equipment:   Intra-op Plan:   Post-operative Plan: Extubation in OR  Informed Consent: I have reviewed the patients History and Physical, chart, labs and discussed the procedure including the risks, benefits and alternatives for the proposed anesthesia with the patient or authorized representative who has indicated his/her understanding and acceptance.     Dental advisory given  Plan Discussed with: CRNA  Anesthesia Plan Comments:        Anesthesia Quick Evaluation

## 2023-12-19 NOTE — H&P (Signed)
 Chief Complaint  Patient presents with  Inguinal Hernia   Subjective  Ethan Cox is a 68 y.o. male patient in today for: Inguinal Hernia  He had had a laparoscopic TEP bilateral inguinal hernia repairs with mesh as well as umbilical hernia repair at the time. He notes a feeling of recurrence in the left side and the postop area. That has persisted. He had confirmation with CT scan last month. He has pain in the center. He has a bulge on the left side. It feels harder than prior to procedure. CT scan also noted small recurrence on the right side.  Social Drivers of Health with Concerns   Housing Stability: Unknown (12/07/2023)  Housing Stability Vital Sign  Homeless in the Last Year: No    No data to display      Outpatient Medications Prior to Visit  Medication Sig Dispense Refill  dorzolamide-timoloL (COSOPT) 22.3-6.8 mg/mL ophthalmic solution Place 1 drop into the right eye 3 (three) times daily  latanoprost (XALATAN) 0.005 % ophthalmic solution  rosuvastatin (CRESTOR) 10 MG tablet Take 1 tablet by mouth once daily  metFORMIN (GLUCOPHAGE) 500 MG tablet Take 500 mg by mouth 2 (two) times daily with meals  multivitamin with minerals Cap Take 1 capsule by mouth once daily   No facility-administered medications prior to visit.   Review of Systems  Constitutional: Negative.  HENT: Negative.  Eyes: Negative.  Respiratory: Negative.  Cardiovascular: Negative.  Gastrointestinal: Negative for nausea.  Genitourinary: Negative.  Musculoskeletal: Negative.  Skin: Negative.  Neurological: Negative.  Endo/Heme/Allergies: Negative.  Psychiatric/Behavioral: Negative.    Objective   Vitals:  12/07/23 1011  BP: (!) 140/80  Pulse: 87  Temp: 36.4 C (97.6 F)  SpO2: 98%  Weight: 80.3 kg (177 lb)  Height: 172.7 cm (5\' 8" )  PainSc: 2   Body mass index is 26.91 kg/m.  Physical Exam Constitutional:  Appearance: Normal appearance.  HENT:  Head: Normocephalic and atraumatic.   Pulmonary:  Effort: Pulmonary effort is normal.  Abdominal:  Comments: Moderate palpable left inguinal hernia, reducible in spine position, no palpable right inguinal hernia. Well-healed laparoscopic scars  Musculoskeletal:  General: Normal range of motion.  Cervical back: Normal range of motion.  Neurological:  General: No focal deficit present.  Mental Status: He is alert and oriented to person, place, and time. Mental status is at baseline.  Psychiatric:  Mood and Affect: Mood normal.  Behavior: Behavior normal.  Thought Content: Thought content normal.     I reviewed I reviewed op note by Dr. Lowell Guitar, I reviewed notes by Farris Has, I reviewed CT scan results from January showing moderate size left inguinal hernia recurrence and concern for small right inguinal hernia recurrence. I have requested access to images of the CT scan for further review.   Assessment/Plan:   Diagnoses and all orders for this visit:  Unilateral recurrent inguinal hernia without obstruction or gangrene  Jovian appears to have an early recurrence of the left inguinal hernia. I discussed technique (laparoscopic TEP) used by Dr. Lowell Guitar in relation to what a hernia is and how hernias are repaired and how they can recur. I discussed the limitations of minimally invasive inguinal hernia repairs in terms of relation to the iliac vessels and the posterior medial area and the nerve bundles in the posterior lateral area limiting fixation of the mesh and requiring overlap which sometimes can lead to failure. We also discussed how size of hernia sac can lead to increased difficulty of dissection and repair and  size of hernia defect can lead to higher risk of recurrence due to the need for appropriate overlap. I discussed the difference between findings on the CT scan findings and reality. Therefore I will review CT findings to look at the right inguinal hernia recurrence versus persistent lipoma of the area. He has no  symptoms on the right side, so I generally think it is smartest to only deal with the left side and an alternative repair technique.  We discussed etiology of hernias and how they can cause pain. We discussed options for inguinal hernia repair vs observation. We discussed details of the surgery of general anesthesia, surgical approach and incisions, dissecting the sack away from vas deference, testicular vessels and nerves and placement of mesh. We discussed risks of bleeding, infection, recurrence, injury to vas deference, testicular vessels, nerve injury, and chronic pain. He showed good understanding and wanted proceed with open left inguinal hernia repair as outpatient.

## 2023-12-19 NOTE — Anesthesia Procedure Notes (Signed)
 Anesthesia Regional Block: TAP block   Pre-Anesthetic Checklist: , timeout performed,  Correct Patient, Correct Site, Correct Laterality,  Correct Procedure, Correct Position, site marked,  Risks and benefits discussed,  Surgical consent,  Pre-op evaluation,  At surgeon's request and post-op pain management  Laterality: Left  Prep: chloraprep       Needles:  Injection technique: Single-shot  Needle Type: Echogenic Needle     Needle Length: 9cm  Needle Gauge: 21     Additional Needles:   Procedures:,,,, ultrasound used (permanent image in chart),,    Narrative:  Start time: 12/19/2023 10:36 AM End time: 12/19/2023 10:46 AM Injection made incrementally with aspirations every 5 mL.  Performed by: Personally  Anesthesiologist: Collene Schlichter, MD  Additional Notes: No pain on injection. No increased resistance to injection. Injection made in 5cc increments.  Good needle visualization.  Patient tolerated procedure well.

## 2023-12-19 NOTE — Transfer of Care (Signed)
 Immediate Anesthesia Transfer of Care Note  Patient: Ethan Cox  Procedure(s) Performed: LEFT RECURRENT INGUINAL HERNIA REPAIR WITH MESH (Left)  Patient Location: PACU  Anesthesia Type:General  Level of Consciousness: awake, alert , and oriented  Airway & Oxygen Therapy: Patient Spontanous Breathing and Patient connected to face mask oxygen  Post-op Assessment: Report given to RN and Post -op Vital signs reviewed and stable  Post vital signs: Reviewed and stable  Last Vitals:  Vitals Value Taken Time  BP 134/76 12/19/23 1232  Temp    Pulse 78 12/19/23 1235  Resp 15 12/19/23 1235  SpO2 100 % 12/19/23 1235  Vitals shown include unfiled device data.  Last Pain:  Vitals:   12/19/23 1055  TempSrc:   PainSc: 1          Complications: No notable events documented.

## 2023-12-19 NOTE — Op Note (Signed)
 Preop diagnosis: left recurrent inguinal hernia  Postop diagnosis: left recurrent inguinal hernia  Procedure: open Left recurrent inguinal hernia repair with mesh  Surgeon: Feliciana Rossetti, M.D.  Asst: none  Anesthesia: Gen.   Indications for procedure: Ethan Cox is a 68 y.o. male with symptoms of pain and enlarging Left inguinal hernia(s). After discussing risks, alternatives and benefits he decided on open repair and was brought to day surgery for repair.  Description of procedure: The patient was brought into the operative suite, placed supine. Anesthesia was administered with endotracheal tube. Patient was strapped in place. The patient was prepped and draped in the usual sterile fashion.  The anterior superior iliac spine and pubic tubercle were identified on the Left side. An incision was made 1cm above the connecting line, representative of the location of the inguinal ligament. The subcutaneous tissue was bluntly dissected, scarpa's fascia was dissected away. The external abdominal oblique fascia was identified and sharply opened down to the external inguinal ring. The conjoint tendon and inguinal ligament were identified. The cord structures and sac were dissected free of the surrounding tissue in 360 degrees. A penrose drain was used to encircle the contents. The cremasteric fibers were dissected free of the contents of the cord and hernia sac. The cord structures (vessels and vas deferens) were identified and carefully dissected away from the hernia sac. The hernia sac contained omentum and was opened and contents reduced into the peritoneal space.The hernia sac was dissected down to the internal inguinal ring. Preperitoneal fat was identified showing appropriate dissection. The sac was then reduced into the preperitoneal space. The canal floor was large and closed with interrupted 2-0 PDS apposing the conjoint tendon to the inguinal ligament medially. A 3x6 Bard mesh was then used to  close the defect and reinforce the floor. The mesh was sutured to the lacunar ligament and inguinal ligament using a 2-0 prolene in running fashion. Next the superior edge of the mesh was sutured to the conjoined tendon using a 2-0 running Prolene. An additional 2-0 Prolene was used to suture the tail ends of the mesh together re-creating the deep ring. Cord structures are running in a neutral position through the mesh. Next the external abdominal oblique fascia was closed with a 2-0 Vicryl in interrupted fashion to re-create the external inguinal ring. Scarpa's fascia was closed with 3-0 Vicryl in running fashion. Skin was closed with a 4-0 Monocryl subcuticular stitch in running fashion. Dermabond place for dressing. Patient woke from anesthesia and brought to PACU in stable condition. All counts are correct.    Findings: left indirect recurrent inguinal hernia, lipoma of the cord  Specimen: none  Blood loss: 20 ml  Local anesthesia: 10 ml Marcaine  Complications: none  Implant: 3 x 6 in Bard mesh  Feliciana Rossetti, M.D. General, Bariatric, & Minimally Invasive Surgery [facility], [place] 12:28 PM 12/19/2023

## 2023-12-19 NOTE — Anesthesia Procedure Notes (Signed)
 Procedure Name: Intubation Date/Time: 12/19/2023 11:10 AM  Performed by: Kenedi Cilia D, CRNAPre-anesthesia Checklist: Patient identified, Emergency Drugs available, Suction available and Patient being monitored Patient Re-evaluated:Patient Re-evaluated prior to induction Oxygen Delivery Method: Circle system utilized Preoxygenation: Pre-oxygenation with 100% oxygen Induction Type: IV induction Ventilation: Mask ventilation without difficulty Laryngoscope Size: Mac and 4 Grade View: Grade III Tube type: Oral Number of attempts: 1 Airway Equipment and Method: Stylet and Oral airway Placement Confirmation: ETT inserted through vocal cords under direct vision, positive ETCO2 and breath sounds checked- equal and bilateral Secured at: 22 cm Tube secured with: Tape Dental Injury: Teeth and Oropharynx as per pre-operative assessment

## 2023-12-19 NOTE — Anesthesia Postprocedure Evaluation (Signed)
 Anesthesia Post Note  Patient: Ethan Cox  Procedure(s) Performed: LEFT RECURRENT INGUINAL HERNIA REPAIR WITH MESH (Left)     Patient location during evaluation: PACU Anesthesia Type: General Level of consciousness: awake and alert Pain management: pain level controlled Vital Signs Assessment: post-procedure vital signs reviewed and stable Respiratory status: spontaneous breathing, nonlabored ventilation and respiratory function stable Cardiovascular status: blood pressure returned to baseline and stable Postop Assessment: no apparent nausea or vomiting Anesthetic complications: no   No notable events documented.  Last Vitals:  Vitals:   12/19/23 1330 12/19/23 1349  BP: (!) 145/82 (!) 157/83  Pulse: 79 74  Resp: 15   Temp:  37.3 C  SpO2: 96% 98%    Last Pain:  Vitals:   12/19/23 1349  TempSrc:   PainSc: 3                  Collene Schlichter

## 2023-12-20 ENCOUNTER — Encounter (HOSPITAL_COMMUNITY): Payer: Self-pay | Admitting: General Surgery

## 2024-02-10 DIAGNOSIS — H401112 Primary open-angle glaucoma, right eye, moderate stage: Secondary | ICD-10-CM | POA: Diagnosis not present

## 2024-02-10 DIAGNOSIS — H401123 Primary open-angle glaucoma, left eye, severe stage: Secondary | ICD-10-CM | POA: Diagnosis not present

## 2024-02-10 DIAGNOSIS — Z9689 Presence of other specified functional implants: Secondary | ICD-10-CM | POA: Diagnosis not present

## 2024-02-10 DIAGNOSIS — H2513 Age-related nuclear cataract, bilateral: Secondary | ICD-10-CM | POA: Diagnosis not present

## 2024-04-03 DIAGNOSIS — H6991 Unspecified Eustachian tube disorder, right ear: Secondary | ICD-10-CM | POA: Diagnosis not present

## 2024-04-03 DIAGNOSIS — H6501 Acute serous otitis media, right ear: Secondary | ICD-10-CM | POA: Diagnosis not present

## 2024-06-21 DIAGNOSIS — Z77098 Contact with and (suspected) exposure to other hazardous, chiefly nonmedicinal, chemicals: Secondary | ICD-10-CM | POA: Diagnosis not present

## 2024-06-21 DIAGNOSIS — Z801 Family history of malignant neoplasm of trachea, bronchus and lung: Secondary | ICD-10-CM | POA: Diagnosis not present

## 2024-06-21 DIAGNOSIS — R911 Solitary pulmonary nodule: Secondary | ICD-10-CM | POA: Diagnosis not present

## 2024-06-22 DIAGNOSIS — Z9689 Presence of other specified functional implants: Secondary | ICD-10-CM | POA: Diagnosis not present

## 2024-06-22 DIAGNOSIS — H2513 Age-related nuclear cataract, bilateral: Secondary | ICD-10-CM | POA: Diagnosis not present

## 2024-06-22 DIAGNOSIS — H401112 Primary open-angle glaucoma, right eye, moderate stage: Secondary | ICD-10-CM | POA: Diagnosis not present

## 2024-06-22 DIAGNOSIS — H401123 Primary open-angle glaucoma, left eye, severe stage: Secondary | ICD-10-CM | POA: Diagnosis not present

## 2024-07-03 DIAGNOSIS — R911 Solitary pulmonary nodule: Secondary | ICD-10-CM | POA: Diagnosis not present

## 2024-07-26 DIAGNOSIS — H2513 Age-related nuclear cataract, bilateral: Secondary | ICD-10-CM | POA: Diagnosis not present

## 2024-07-26 DIAGNOSIS — H0288B Meibomian gland dysfunction left eye, upper and lower eyelids: Secondary | ICD-10-CM | POA: Diagnosis not present

## 2024-07-26 DIAGNOSIS — H401112 Primary open-angle glaucoma, right eye, moderate stage: Secondary | ICD-10-CM | POA: Diagnosis not present

## 2024-07-26 DIAGNOSIS — H401123 Primary open-angle glaucoma, left eye, severe stage: Secondary | ICD-10-CM | POA: Diagnosis not present

## 2024-07-26 DIAGNOSIS — H0288A Meibomian gland dysfunction right eye, upper and lower eyelids: Secondary | ICD-10-CM | POA: Diagnosis not present

## 2024-07-26 DIAGNOSIS — Z9689 Presence of other specified functional implants: Secondary | ICD-10-CM | POA: Diagnosis not present

## 2024-10-22 DIAGNOSIS — H401112 Primary open-angle glaucoma, right eye, moderate stage: Secondary | ICD-10-CM | POA: Diagnosis not present

## 2024-10-22 DIAGNOSIS — H2513 Age-related nuclear cataract, bilateral: Secondary | ICD-10-CM | POA: Diagnosis not present

## 2024-10-22 DIAGNOSIS — H0288B Meibomian gland dysfunction left eye, upper and lower eyelids: Secondary | ICD-10-CM | POA: Diagnosis not present

## 2024-10-22 DIAGNOSIS — Z9689 Presence of other specified functional implants: Secondary | ICD-10-CM | POA: Diagnosis not present

## 2024-10-22 DIAGNOSIS — H0288A Meibomian gland dysfunction right eye, upper and lower eyelids: Secondary | ICD-10-CM | POA: Diagnosis not present

## 2024-10-22 DIAGNOSIS — H401123 Primary open-angle glaucoma, left eye, severe stage: Secondary | ICD-10-CM | POA: Diagnosis not present
